# Patient Record
Sex: Male | Born: 1980
Health system: Southern US, Community
[De-identification: ages and names within clinical notes are randomized; demographics above are authoritative.]

## PROBLEM LIST (undated history)

## (undated) DIAGNOSIS — B20 Human immunodeficiency virus [HIV] disease: Secondary | ICD-10-CM

## (undated) DIAGNOSIS — G44009 Cluster headache syndrome, unspecified, not intractable: Secondary | ICD-10-CM

## (undated) DIAGNOSIS — Z21 Asymptomatic human immunodeficiency virus [HIV] infection status: Secondary | ICD-10-CM

## (undated) HISTORY — DX: Cluster headache syndrome, unspecified, not intractable: G44.009

## (undated) HISTORY — DX: Human immunodeficiency virus (HIV) disease: B20

## (undated) HISTORY — DX: Asymptomatic human immunodeficiency virus (hiv) infection status: Z21

---

## 2005-01-29 ENCOUNTER — Encounter (INDEPENDENT_AMBULATORY_CARE_PROVIDER_SITE_OTHER): Payer: Self-pay | Admitting: *Deleted

## 2005-01-29 LAB — CONVERTED CEMR LAB
CD4 Count: 390 microliters
CD4 T Cell Abs: 390

## 2005-07-01 ENCOUNTER — Encounter (INDEPENDENT_AMBULATORY_CARE_PROVIDER_SITE_OTHER): Payer: Self-pay | Admitting: *Deleted

## 2005-07-01 LAB — CONVERTED CEMR LAB
CD4 Count: 595 microliters
HIV 1 RNA Quant: 65 copies/mL

## 2005-12-24 ENCOUNTER — Encounter (INDEPENDENT_AMBULATORY_CARE_PROVIDER_SITE_OTHER): Payer: Self-pay | Admitting: *Deleted

## 2005-12-24 LAB — CONVERTED CEMR LAB
CD4 Count: 638 microliters
HIV 1 RNA Quant: 49 copies/mL

## 2006-03-10 ENCOUNTER — Ambulatory Visit: Payer: Self-pay | Admitting: Internal Medicine

## 2006-03-10 ENCOUNTER — Encounter: Admission: RE | Admit: 2006-03-10 | Discharge: 2006-03-10 | Payer: Self-pay | Admitting: Internal Medicine

## 2006-03-10 ENCOUNTER — Encounter (INDEPENDENT_AMBULATORY_CARE_PROVIDER_SITE_OTHER): Payer: Self-pay | Admitting: *Deleted

## 2006-03-10 LAB — CONVERTED CEMR LAB
CD4 Count: 640 microliters
HIV 1 RNA Quant: 49 copies/mL

## 2006-03-25 ENCOUNTER — Ambulatory Visit: Payer: Self-pay | Admitting: Internal Medicine

## 2006-04-29 ENCOUNTER — Ambulatory Visit: Payer: Self-pay | Admitting: Gastroenterology

## 2006-04-29 DIAGNOSIS — B20 Human immunodeficiency virus [HIV] disease: Secondary | ICD-10-CM

## 2006-04-29 DIAGNOSIS — K029 Dental caries, unspecified: Secondary | ICD-10-CM | POA: Insufficient documentation

## 2006-04-29 DIAGNOSIS — A630 Anogenital (venereal) warts: Secondary | ICD-10-CM

## 2006-05-19 ENCOUNTER — Encounter: Payer: Self-pay | Admitting: Internal Medicine

## 2006-06-08 DIAGNOSIS — R519 Headache, unspecified: Secondary | ICD-10-CM | POA: Insufficient documentation

## 2006-06-08 DIAGNOSIS — R51 Headache: Secondary | ICD-10-CM

## 2006-06-20 ENCOUNTER — Encounter (INDEPENDENT_AMBULATORY_CARE_PROVIDER_SITE_OTHER): Payer: Self-pay | Admitting: *Deleted

## 2006-06-20 LAB — CONVERTED CEMR LAB

## 2006-06-27 ENCOUNTER — Ambulatory Visit (HOSPITAL_COMMUNITY): Admission: RE | Admit: 2006-06-27 | Discharge: 2006-06-27 | Payer: Self-pay | Admitting: Surgery

## 2006-06-27 ENCOUNTER — Encounter (INDEPENDENT_AMBULATORY_CARE_PROVIDER_SITE_OTHER): Payer: Self-pay | Admitting: Specialist

## 2006-07-03 ENCOUNTER — Encounter (INDEPENDENT_AMBULATORY_CARE_PROVIDER_SITE_OTHER): Payer: Self-pay | Admitting: *Deleted

## 2006-07-04 ENCOUNTER — Ambulatory Visit: Payer: Self-pay | Admitting: Internal Medicine

## 2006-07-04 ENCOUNTER — Encounter: Admission: RE | Admit: 2006-07-04 | Discharge: 2006-07-04 | Payer: Self-pay | Admitting: Internal Medicine

## 2006-07-04 LAB — CONVERTED CEMR LAB
ALT: 26 units/L (ref 0–53)
AST: 17 units/L (ref 0–37)
Albumin: 4.6 g/dL (ref 3.5–5.2)
Alkaline Phosphatase: 83 units/L (ref 39–117)
BUN: 13 mg/dL (ref 6–23)
Basophils Absolute: 0 10*3/uL (ref 0.0–0.1)
Basophils Relative: 0 % (ref 0–1)
CD4 % Helper T Cell: 33 %
CD4 Count: 660 microliters
CO2: 24 meq/L (ref 19–32)
Calcium: 9.6 mg/dL (ref 8.4–10.5)
Chloride: 108 meq/L (ref 96–112)
Creatinine, Ser: 1.03 mg/dL (ref 0.40–1.50)
Eosinophils Absolute: 0.2 10*3/uL (ref 0.0–0.7)
Eosinophils Relative: 2 % (ref 0–5)
Glucose, Bld: 80 mg/dL (ref 70–99)
HCT: 42.2 % (ref 39.0–52.0)
HIV 1 RNA Quant: 114 copies/mL — ABNORMAL HIGH (ref <50)
HIV-1 RNA Quant, Log: 2.06 — ABNORMAL HIGH (ref <1.70)
Hemoglobin: 13.8 g/dL (ref 13.0–17.0)
Lymphocytes Relative: 24 % (ref 12–46)
Lymphs Abs: 2.1 10*3/uL (ref 0.7–3.3)
MCHC: 32.7 g/dL (ref 30.0–36.0)
MCV: 86.3 fL (ref 78.0–100.0)
Monocytes Absolute: 1.2 10*3/uL — ABNORMAL HIGH (ref 0.2–0.7)
Monocytes Relative: 13 % — ABNORMAL HIGH (ref 3–11)
Neutro Abs: 5.3 10*3/uL (ref 1.7–7.7)
Neutrophils Relative %: 60 % (ref 43–77)
Platelets: 274 10*3/uL (ref 150–400)
Potassium: 4.7 meq/L (ref 3.5–5.3)
RBC: 4.89 M/uL (ref 4.22–5.81)
RDW: 14.2 % — ABNORMAL HIGH (ref 11.5–14.0)
Sodium: 143 meq/L (ref 135–145)
Total Bilirubin: 0.3 mg/dL (ref 0.3–1.2)
Total Protein: 7.6 g/dL (ref 6.0–8.3)
WBC: 8.9 10*3/uL (ref 4.0–10.5)

## 2006-07-13 ENCOUNTER — Ambulatory Visit: Payer: Self-pay | Admitting: Internal Medicine

## 2006-09-06 ENCOUNTER — Telehealth (INDEPENDENT_AMBULATORY_CARE_PROVIDER_SITE_OTHER): Payer: Self-pay | Admitting: *Deleted

## 2006-09-08 ENCOUNTER — Ambulatory Visit: Payer: Self-pay | Admitting: Internal Medicine

## 2006-09-08 ENCOUNTER — Encounter: Admission: RE | Admit: 2006-09-08 | Discharge: 2006-09-08 | Payer: Self-pay | Admitting: Internal Medicine

## 2006-09-08 LAB — CONVERTED CEMR LAB
ALT: 55 units/L — ABNORMAL HIGH (ref 0–53)
AST: 33 units/L (ref 0–37)
Albumin: 4.5 g/dL (ref 3.5–5.2)
Alkaline Phosphatase: 75 units/L (ref 39–117)
BUN: 15 mg/dL (ref 6–23)
Basophils Absolute: 0 10*3/uL (ref 0.0–0.1)
Basophils Relative: 0 % (ref 0–1)
CD4 Count: 690 microliters
CO2: 25 meq/L (ref 19–32)
Calcium: 9.6 mg/dL (ref 8.4–10.5)
Chloride: 104 meq/L (ref 96–112)
Creatinine, Ser: 1.06 mg/dL (ref 0.40–1.50)
Eosinophils Absolute: 0.2 10*3/uL (ref 0.0–0.7)
Eosinophils Relative: 3 % (ref 0–5)
Glucose, Bld: 84 mg/dL (ref 70–99)
HCT: 43.5 % (ref 39.0–52.0)
HIV 1 RNA Quant: 147 copies/mL — ABNORMAL HIGH (ref <50)
HIV-1 RNA Quant, Log: 2.17 — ABNORMAL HIGH (ref <1.70)
Hemoglobin: 14 g/dL (ref 13.0–17.0)
Lymphocytes Relative: 28 % (ref 12–46)
Lymphs Abs: 2.3 10*3/uL (ref 0.7–3.3)
MCHC: 32.2 g/dL (ref 30.0–36.0)
MCV: 89.7 fL (ref 78.0–100.0)
Monocytes Absolute: 1 10*3/uL — ABNORMAL HIGH (ref 0.2–0.7)
Monocytes Relative: 13 % — ABNORMAL HIGH (ref 3–11)
Neutro Abs: 4.5 10*3/uL (ref 1.7–7.7)
Neutrophils Relative %: 56 % (ref 43–77)
Platelets: 238 10*3/uL (ref 150–400)
Potassium: 4.8 meq/L (ref 3.5–5.3)
RBC: 4.85 M/uL (ref 4.22–5.81)
RDW: 14.7 % — ABNORMAL HIGH (ref 11.5–14.0)
Sodium: 140 meq/L (ref 135–145)
Total Bilirubin: 0.5 mg/dL (ref 0.3–1.2)
Total Protein: 7.7 g/dL (ref 6.0–8.3)
WBC: 8 10*3/uL (ref 4.0–10.5)

## 2006-09-23 ENCOUNTER — Ambulatory Visit: Payer: Self-pay | Admitting: Internal Medicine

## 2006-09-23 DIAGNOSIS — M79609 Pain in unspecified limb: Secondary | ICD-10-CM

## 2006-09-23 DIAGNOSIS — J309 Allergic rhinitis, unspecified: Secondary | ICD-10-CM

## 2006-09-23 DIAGNOSIS — M26609 Unspecified temporomandibular joint disorder, unspecified side: Secondary | ICD-10-CM | POA: Insufficient documentation

## 2006-10-07 ENCOUNTER — Ambulatory Visit: Payer: Self-pay | Admitting: Internal Medicine

## 2006-10-09 ENCOUNTER — Ambulatory Visit (HOSPITAL_COMMUNITY): Admission: RE | Admit: 2006-10-09 | Discharge: 2006-10-09 | Payer: Self-pay | Admitting: Internal Medicine

## 2006-10-10 ENCOUNTER — Telehealth: Payer: Self-pay | Admitting: Internal Medicine

## 2006-10-11 ENCOUNTER — Encounter: Payer: Self-pay | Admitting: Internal Medicine

## 2006-10-20 ENCOUNTER — Telehealth: Payer: Self-pay | Admitting: Internal Medicine

## 2006-10-25 ENCOUNTER — Telehealth: Payer: Self-pay | Admitting: Internal Medicine

## 2006-10-25 ENCOUNTER — Encounter: Payer: Self-pay | Admitting: Internal Medicine

## 2006-11-21 ENCOUNTER — Telehealth: Payer: Self-pay | Admitting: Internal Medicine

## 2006-11-21 ENCOUNTER — Encounter: Payer: Self-pay | Admitting: Internal Medicine

## 2006-11-25 ENCOUNTER — Ambulatory Visit: Payer: Self-pay | Admitting: Internal Medicine

## 2006-11-25 ENCOUNTER — Ambulatory Visit (HOSPITAL_COMMUNITY): Admission: RE | Admit: 2006-11-25 | Discharge: 2006-11-25 | Payer: Self-pay | Admitting: Internal Medicine

## 2006-11-25 ENCOUNTER — Encounter: Admission: RE | Admit: 2006-11-25 | Discharge: 2006-11-25 | Payer: Self-pay | Admitting: Internal Medicine

## 2006-11-25 DIAGNOSIS — G43809 Other migraine, not intractable, without status migrainosus: Secondary | ICD-10-CM

## 2006-11-25 LAB — CONVERTED CEMR LAB
ALT: 38 units/L (ref 0–53)
AST: 20 units/L (ref 0–37)
Albumin: 4.7 g/dL (ref 3.5–5.2)
Alkaline Phosphatase: 69 units/L (ref 39–117)
BUN: 14 mg/dL (ref 6–23)
Basophils Absolute: 0 10*3/uL (ref 0.0–0.1)
Basophils Relative: 0 % (ref 0–1)
CO2: 26 meq/L (ref 19–32)
Calcium: 9.8 mg/dL (ref 8.4–10.5)
Chloride: 105 meq/L (ref 96–112)
Creatinine, Ser: 1.03 mg/dL (ref 0.40–1.50)
Eosinophils Absolute: 0.2 10*3/uL (ref 0.0–0.7)
Eosinophils Relative: 2 % (ref 0–5)
Glucose, Bld: 91 mg/dL (ref 70–99)
HCT: 42.7 % (ref 39.0–52.0)
HIV 1 RNA Quant: 68 copies/mL — ABNORMAL HIGH (ref <50)
HIV-1 RNA Quant, Log: 1.83 — ABNORMAL HIGH (ref <1.70)
Hemoglobin: 14.3 g/dL (ref 13.0–17.0)
Lymphocytes Relative: 31 % (ref 12–46)
Lymphs Abs: 2.3 10*3/uL (ref 0.7–3.3)
MCHC: 33.5 g/dL (ref 30.0–36.0)
MCV: 87.3 fL (ref 78.0–100.0)
Monocytes Absolute: 1 10*3/uL — ABNORMAL HIGH (ref 0.2–0.7)
Monocytes Relative: 13 % — ABNORMAL HIGH (ref 3–11)
Neutro Abs: 3.9 10*3/uL (ref 1.7–7.7)
Neutrophils Relative %: 53 % (ref 43–77)
Platelets: 244 10*3/uL (ref 150–400)
Potassium: 4.6 meq/L (ref 3.5–5.3)
RBC: 4.89 M/uL (ref 4.22–5.81)
RDW: 14 % (ref 11.5–14.0)
Sodium: 140 meq/L (ref 135–145)
Total Bilirubin: 0.4 mg/dL (ref 0.3–1.2)
Total Protein: 7.6 g/dL (ref 6.0–8.3)
WBC: 7.4 10*3/uL (ref 4.0–10.5)

## 2006-11-28 ENCOUNTER — Telehealth: Payer: Self-pay | Admitting: Internal Medicine

## 2006-11-30 ENCOUNTER — Telehealth: Payer: Self-pay | Admitting: Internal Medicine

## 2006-12-09 ENCOUNTER — Encounter (INDEPENDENT_AMBULATORY_CARE_PROVIDER_SITE_OTHER): Payer: Self-pay | Admitting: *Deleted

## 2006-12-14 ENCOUNTER — Ambulatory Visit: Payer: Self-pay | Admitting: Internal Medicine

## 2007-01-19 ENCOUNTER — Telehealth: Payer: Self-pay | Admitting: Internal Medicine

## 2007-02-15 ENCOUNTER — Ambulatory Visit: Payer: Self-pay | Admitting: Internal Medicine

## 2007-02-15 DIAGNOSIS — J019 Acute sinusitis, unspecified: Secondary | ICD-10-CM

## 2007-02-15 DIAGNOSIS — F17210 Nicotine dependence, cigarettes, uncomplicated: Secondary | ICD-10-CM | POA: Insufficient documentation

## 2007-02-16 ENCOUNTER — Encounter (INDEPENDENT_AMBULATORY_CARE_PROVIDER_SITE_OTHER): Payer: Self-pay | Admitting: *Deleted

## 2007-02-21 ENCOUNTER — Encounter: Payer: Self-pay | Admitting: Internal Medicine

## 2007-02-22 ENCOUNTER — Encounter: Payer: Self-pay | Admitting: Internal Medicine

## 2007-03-06 ENCOUNTER — Ambulatory Visit (HOSPITAL_COMMUNITY): Admission: RE | Admit: 2007-03-06 | Discharge: 2007-03-06 | Payer: Self-pay | Admitting: Surgery

## 2007-03-06 ENCOUNTER — Encounter (INDEPENDENT_AMBULATORY_CARE_PROVIDER_SITE_OTHER): Payer: Self-pay | Admitting: Surgery

## 2007-03-14 ENCOUNTER — Encounter: Admission: RE | Admit: 2007-03-14 | Discharge: 2007-03-14 | Payer: Self-pay | Admitting: Internal Medicine

## 2007-03-14 ENCOUNTER — Ambulatory Visit: Payer: Self-pay | Admitting: Internal Medicine

## 2007-03-14 LAB — CONVERTED CEMR LAB
ALT: 14 units/L (ref 0–53)
AST: 15 units/L (ref 0–37)
Albumin: 4.4 g/dL (ref 3.5–5.2)
Alkaline Phosphatase: 84 units/L (ref 39–117)
BUN: 11 mg/dL (ref 6–23)
Basophils Absolute: 0 10*3/uL (ref 0.0–0.1)
Basophils Relative: 0 % (ref 0–1)
CO2: 27 meq/L (ref 19–32)
Calcium: 9.5 mg/dL (ref 8.4–10.5)
Chloride: 106 meq/L (ref 96–112)
Creatinine, Ser: 1.04 mg/dL (ref 0.40–1.50)
Eosinophils Absolute: 0.2 10*3/uL (ref 0.2–0.7)
Eosinophils Relative: 2 % (ref 0–5)
Glucose, Bld: 100 mg/dL — ABNORMAL HIGH (ref 70–99)
HCT: 42.6 % (ref 39.0–52.0)
HIV 1 RNA Quant: <50 copies/mL (ref <50)
HIV-1 RNA Quant, Log: <1.7 (ref <1.70)
Hemoglobin: 13.9 g/dL (ref 13.0–17.0)
Lymphocytes Relative: 28 % (ref 12–46)
Lymphs Abs: 2.5 10*3/uL (ref 0.7–4.0)
MCHC: 32.6 g/dL (ref 30.0–36.0)
MCV: 88 fL (ref 78.0–100.0)
Monocytes Absolute: 0.7 10*3/uL (ref 0.1–1.0)
Monocytes Relative: 8 % (ref 3–12)
Neutro Abs: 5.4 10*3/uL (ref 1.7–7.7)
Neutrophils Relative %: 61 % (ref 43–77)
Platelets: 253 10*3/uL (ref 150–400)
Potassium: 4 meq/L (ref 3.5–5.3)
RBC: 4.84 M/uL (ref 4.22–5.81)
RDW: 13.1 % (ref 11.5–15.5)
Sodium: 142 meq/L (ref 135–145)
Total Bilirubin: 0.3 mg/dL (ref 0.3–1.2)
Total Protein: 7.4 g/dL (ref 6.0–8.3)
WBC: 8.8 10*3/uL (ref 4.0–10.5)

## 2007-03-27 ENCOUNTER — Telehealth: Payer: Self-pay | Admitting: Internal Medicine

## 2007-03-28 ENCOUNTER — Telehealth: Payer: Self-pay | Admitting: Internal Medicine

## 2007-03-29 ENCOUNTER — Encounter (INDEPENDENT_AMBULATORY_CARE_PROVIDER_SITE_OTHER): Payer: Self-pay | Admitting: *Deleted

## 2007-03-29 ENCOUNTER — Ambulatory Visit: Payer: Self-pay | Admitting: Internal Medicine

## 2007-04-24 ENCOUNTER — Encounter (INDEPENDENT_AMBULATORY_CARE_PROVIDER_SITE_OTHER): Payer: Self-pay | Admitting: *Deleted

## 2007-06-06 ENCOUNTER — Telehealth: Payer: Self-pay

## 2007-06-09 ENCOUNTER — Ambulatory Visit: Payer: Self-pay | Admitting: Internal Medicine

## 2007-06-09 DIAGNOSIS — R112 Nausea with vomiting, unspecified: Secondary | ICD-10-CM

## 2007-06-19 ENCOUNTER — Telehealth: Payer: Self-pay | Admitting: Internal Medicine

## 2007-07-03 ENCOUNTER — Telehealth: Payer: Self-pay | Admitting: Internal Medicine

## 2007-07-03 ENCOUNTER — Encounter: Admission: RE | Admit: 2007-07-03 | Discharge: 2007-07-03 | Payer: Self-pay | Admitting: Internal Medicine

## 2007-07-03 ENCOUNTER — Ambulatory Visit: Payer: Self-pay | Admitting: Internal Medicine

## 2007-07-03 LAB — CONVERTED CEMR LAB
ALT: 25 units/L (ref 0–53)
AST: 20 units/L (ref 0–37)
Albumin: 4.5 g/dL (ref 3.5–5.2)
Alkaline Phosphatase: 70 units/L (ref 39–117)
BUN: 8 mg/dL (ref 6–23)
Basophils Absolute: 0 10*3/uL (ref 0.0–0.1)
Basophils Relative: 1 % (ref 0–1)
CO2: 32 meq/L (ref 19–32)
Calcium: 9.4 mg/dL (ref 8.4–10.5)
Chloride: 102 meq/L (ref 96–112)
Creatinine, Ser: 0.98 mg/dL (ref 0.40–1.50)
Eosinophils Absolute: 0.1 10*3/uL (ref 0.0–0.7)
Eosinophils Relative: 2 % (ref 0–5)
Glucose, Bld: 94 mg/dL (ref 70–99)
HCT: 38.3 % — ABNORMAL LOW (ref 39.0–52.0)
HIV 1 RNA Quant: 57 copies/mL — ABNORMAL HIGH (ref <50)
HIV-1 RNA Quant, Log: 1.76 — ABNORMAL HIGH (ref <1.70)
Hemoglobin: 13 g/dL (ref 13.0–17.0)
Lymphocytes Relative: 28 % (ref 12–46)
Lymphs Abs: 1.9 10*3/uL (ref 0.7–4.0)
MCHC: 33.9 g/dL (ref 30.0–36.0)
MCV: 85.1 fL (ref 78.0–100.0)
Monocytes Absolute: 0.7 10*3/uL (ref 0.1–1.0)
Monocytes Relative: 10 % (ref 3–12)
Neutro Abs: 4 10*3/uL (ref 1.7–7.7)
Neutrophils Relative %: 60 % (ref 43–77)
Platelets: 268 10*3/uL (ref 150–400)
Potassium: 3.2 meq/L — ABNORMAL LOW (ref 3.5–5.3)
RBC: 4.5 M/uL (ref 4.22–5.81)
RDW: 14 % (ref 11.5–15.5)
Sodium: 139 meq/L (ref 135–145)
Total Bilirubin: 0.4 mg/dL (ref 0.3–1.2)
Total Protein: 7 g/dL (ref 6.0–8.3)
WBC: 6.7 10*3/uL (ref 4.0–10.5)

## 2007-07-12 ENCOUNTER — Telehealth (INDEPENDENT_AMBULATORY_CARE_PROVIDER_SITE_OTHER): Payer: Self-pay | Admitting: Licensed Clinical Social Worker

## 2007-07-12 ENCOUNTER — Ambulatory Visit: Payer: Self-pay | Admitting: Internal Medicine

## 2007-07-20 ENCOUNTER — Encounter (INDEPENDENT_AMBULATORY_CARE_PROVIDER_SITE_OTHER): Payer: Self-pay | Admitting: *Deleted

## 2007-07-21 ENCOUNTER — Ambulatory Visit (HOSPITAL_COMMUNITY): Admission: RE | Admit: 2007-07-21 | Discharge: 2007-07-21 | Payer: Self-pay | Admitting: Internal Medicine

## 2007-07-24 ENCOUNTER — Telehealth: Payer: Self-pay | Admitting: Internal Medicine

## 2007-07-26 ENCOUNTER — Telehealth: Payer: Self-pay | Admitting: Internal Medicine

## 2007-08-23 ENCOUNTER — Telehealth: Payer: Self-pay | Admitting: Internal Medicine

## 2007-08-29 ENCOUNTER — Telehealth: Payer: Self-pay | Admitting: Internal Medicine

## 2007-09-21 ENCOUNTER — Telehealth: Payer: Self-pay | Admitting: Internal Medicine

## 2007-10-17 ENCOUNTER — Encounter: Admission: RE | Admit: 2007-10-17 | Discharge: 2007-10-17 | Payer: Self-pay | Admitting: Internal Medicine

## 2007-10-17 ENCOUNTER — Ambulatory Visit: Payer: Self-pay | Admitting: Internal Medicine

## 2007-10-17 LAB — CONVERTED CEMR LAB
ALT: 29 units/L (ref 0–53)
AST: 19 units/L (ref 0–37)
Albumin: 4.7 g/dL (ref 3.5–5.2)
Alkaline Phosphatase: 79 units/L (ref 39–117)
BUN: 11 mg/dL (ref 6–23)
Basophils Absolute: 0 10*3/uL (ref 0.0–0.1)
Basophils Relative: 1 % (ref 0–1)
CO2: 26 meq/L (ref 19–32)
Calcium: 9.8 mg/dL (ref 8.4–10.5)
Chloride: 107 meq/L (ref 96–112)
Creatinine, Ser: 0.95 mg/dL (ref 0.40–1.50)
Eosinophils Absolute: 0.2 10*3/uL (ref 0.0–0.7)
Eosinophils Relative: 2 % (ref 0–5)
Glucose, Bld: 90 mg/dL (ref 70–99)
HCT: 41.5 % (ref 39.0–52.0)
HIV 1 RNA Quant: <50 copies/mL (ref <50)
HIV-1 RNA Quant, Log: <1.7 (ref <1.70)
Hemoglobin: 13.8 g/dL (ref 13.0–17.0)
Lymphocytes Relative: 29 % (ref 12–46)
Lymphs Abs: 2 10*3/uL (ref 0.7–4.0)
MCHC: 33.3 g/dL (ref 30.0–36.0)
MCV: 87.2 fL (ref 78.0–100.0)
Monocytes Absolute: 0.8 10*3/uL (ref 0.1–1.0)
Monocytes Relative: 12 % (ref 3–12)
Neutro Abs: 3.8 10*3/uL (ref 1.7–7.7)
Neutrophils Relative %: 56 % (ref 43–77)
Platelets: 259 10*3/uL (ref 150–400)
Potassium: 4.6 meq/L (ref 3.5–5.3)
RBC: 4.76 M/uL (ref 4.22–5.81)
RDW: 14.3 % (ref 11.5–15.5)
Sodium: 141 meq/L (ref 135–145)
Total Bilirubin: 0.5 mg/dL (ref 0.3–1.2)
Total Protein: 7.4 g/dL (ref 6.0–8.3)
WBC: 6.8 10*3/uL (ref 4.0–10.5)

## 2007-10-24 ENCOUNTER — Ambulatory Visit: Payer: Self-pay | Admitting: Internal Medicine

## 2007-11-13 ENCOUNTER — Telehealth: Payer: Self-pay | Admitting: Internal Medicine

## 2007-11-16 ENCOUNTER — Encounter: Payer: Self-pay | Admitting: Internal Medicine

## 2007-11-22 ENCOUNTER — Telehealth: Payer: Self-pay | Admitting: Internal Medicine

## 2007-12-18 ENCOUNTER — Telehealth (INDEPENDENT_AMBULATORY_CARE_PROVIDER_SITE_OTHER): Payer: Self-pay | Admitting: *Deleted

## 2007-12-26 ENCOUNTER — Telehealth: Payer: Self-pay | Admitting: Internal Medicine

## 2007-12-27 ENCOUNTER — Ambulatory Visit: Payer: Self-pay | Admitting: Internal Medicine

## 2007-12-27 DIAGNOSIS — R21 Rash and other nonspecific skin eruption: Secondary | ICD-10-CM

## 2007-12-27 LAB — CONVERTED CEMR LAB
ALT: 23 units/L (ref 0–53)
AST: 17 units/L (ref 0–37)
Albumin: 5 g/dL (ref 3.5–5.2)
Alkaline Phosphatase: 81 units/L (ref 39–117)
BUN: 10 mg/dL (ref 6–23)
Basophils Absolute: 0 10*3/uL (ref 0.0–0.1)
Basophils Relative: 1 % (ref 0–1)
CO2: 23 meq/L (ref 19–32)
Calcium: 9.7 mg/dL (ref 8.4–10.5)
Chloride: 105 meq/L (ref 96–112)
Cholesterol: 196 mg/dL (ref 0–200)
Creatinine, Ser: 0.9 mg/dL (ref 0.40–1.50)
Eosinophils Absolute: 0.2 10*3/uL (ref 0.0–0.7)
Eosinophils Relative: 2 % (ref 0–5)
Glucose, Bld: 89 mg/dL (ref 70–99)
HCT: 41.8 % (ref 39.0–52.0)
HDL: 41 mg/dL (ref >39)
HIV 1 RNA Quant: 186 copies/mL — ABNORMAL HIGH (ref <50)
HIV-1 RNA Quant, Log: 2.27 — ABNORMAL HIGH (ref <1.70)
Hemoglobin: 14.3 g/dL (ref 13.0–17.0)
LDL Cholesterol: 133 mg/dL — ABNORMAL HIGH (ref 0–99)
Lymphocytes Relative: 30 % (ref 12–46)
Lymphs Abs: 2.5 10*3/uL (ref 0.7–4.0)
MCHC: 34.2 g/dL (ref 30.0–36.0)
MCV: 85.3 fL (ref 78.0–100.0)
Monocytes Absolute: 0.9 10*3/uL (ref 0.1–1.0)
Monocytes Relative: 11 % (ref 3–12)
Neutro Abs: 4.8 10*3/uL (ref 1.7–7.7)
Neutrophils Relative %: 57 % (ref 43–77)
Platelets: 284 10*3/uL (ref 150–400)
Potassium: 4.3 meq/L (ref 3.5–5.3)
RBC: 4.9 M/uL (ref 4.22–5.81)
RDW: 13.4 % (ref 11.5–15.5)
Sodium: 140 meq/L (ref 135–145)
Total Bilirubin: 0.6 mg/dL (ref 0.3–1.2)
Total CHOL/HDL Ratio: 4.8
Total Protein: 8.1 g/dL (ref 6.0–8.3)
Triglycerides: 109 mg/dL (ref <150)
VLDL: 22 mg/dL (ref 0–40)
WBC: 8.4 10*3/uL (ref 4.0–10.5)

## 2008-01-02 ENCOUNTER — Telehealth: Payer: Self-pay

## 2008-01-10 ENCOUNTER — Ambulatory Visit: Payer: Self-pay | Admitting: Internal Medicine

## 2008-02-27 ENCOUNTER — Ambulatory Visit: Payer: Self-pay | Admitting: Internal Medicine

## 2008-05-31 ENCOUNTER — Ambulatory Visit: Payer: Self-pay | Admitting: Internal Medicine

## 2008-05-31 LAB — CONVERTED CEMR LAB
ALT: 27 units/L (ref 0–53)
AST: 19 units/L (ref 0–37)
Albumin: 4.4 g/dL (ref 3.5–5.2)
Alkaline Phosphatase: 73 units/L (ref 39–117)
BUN: 9 mg/dL (ref 6–23)
Basophils Absolute: 0 10*3/uL (ref 0.0–0.1)
Basophils Relative: 0 % (ref 0–1)
CO2: 26 meq/L (ref 19–32)
Calcium: 9.5 mg/dL (ref 8.4–10.5)
Chloride: 106 meq/L (ref 96–112)
Creatinine, Ser: 1.08 mg/dL (ref 0.40–1.50)
Eosinophils Absolute: 0.2 10*3/uL (ref 0.0–0.7)
Eosinophils Relative: 3 % (ref 0–5)
Glucose, Bld: 91 mg/dL (ref 70–99)
HCT: 45.2 % (ref 39.0–52.0)
HIV 1 RNA Quant: 63 copies/mL — ABNORMAL HIGH (ref <48)
HIV-1 RNA Quant, Log: 1.8 — ABNORMAL HIGH (ref <1.68)
Hemoglobin: 14.8 g/dL (ref 13.0–17.0)
Lymphocytes Relative: 33 % (ref 12–46)
Lymphs Abs: 2.7 10*3/uL (ref 0.7–4.0)
MCHC: 32.7 g/dL (ref 30.0–36.0)
MCV: 89.3 fL (ref 78.0–100.0)
Monocytes Absolute: 1 10*3/uL (ref 0.1–1.0)
Monocytes Relative: 13 % — ABNORMAL HIGH (ref 3–12)
Neutro Abs: 4.3 10*3/uL (ref 1.7–7.7)
Neutrophils Relative %: 51 % (ref 43–77)
Platelets: 244 10*3/uL (ref 150–400)
Potassium: 4.4 meq/L (ref 3.5–5.3)
RBC: 5.06 M/uL (ref 4.22–5.81)
RDW: 13.9 % (ref 11.5–15.5)
Sodium: 141 meq/L (ref 135–145)
Total Bilirubin: 0.3 mg/dL (ref 0.3–1.2)
Total Protein: 7.3 g/dL (ref 6.0–8.3)
WBC: 8.3 10*3/uL (ref 4.0–10.5)

## 2008-06-14 ENCOUNTER — Ambulatory Visit: Payer: Self-pay | Admitting: Internal Medicine

## 2008-06-14 ENCOUNTER — Encounter (INDEPENDENT_AMBULATORY_CARE_PROVIDER_SITE_OTHER): Payer: Self-pay | Admitting: *Deleted

## 2008-06-19 ENCOUNTER — Ambulatory Visit: Payer: Self-pay | Admitting: Internal Medicine

## 2008-07-11 ENCOUNTER — Encounter (INDEPENDENT_AMBULATORY_CARE_PROVIDER_SITE_OTHER): Payer: Self-pay | Admitting: *Deleted

## 2008-07-16 ENCOUNTER — Telehealth (INDEPENDENT_AMBULATORY_CARE_PROVIDER_SITE_OTHER): Payer: Self-pay | Admitting: *Deleted

## 2008-07-30 ENCOUNTER — Telehealth (INDEPENDENT_AMBULATORY_CARE_PROVIDER_SITE_OTHER): Payer: Self-pay | Admitting: *Deleted

## 2008-09-27 ENCOUNTER — Ambulatory Visit: Payer: Self-pay | Admitting: Internal Medicine

## 2008-09-27 LAB — CONVERTED CEMR LAB
ALT: 29 units/L (ref 0–53)
AST: 21 units/L (ref 0–37)
Albumin: 4.4 g/dL (ref 3.5–5.2)
Alkaline Phosphatase: 65 units/L (ref 39–117)
BUN: 8 mg/dL (ref 6–23)
Basophils Absolute: 0 10*3/uL (ref 0.0–0.1)
Basophils Relative: 0 % (ref 0–1)
CO2: 24 meq/L (ref 19–32)
Calcium: 8.9 mg/dL (ref 8.4–10.5)
Chloride: 107 meq/L (ref 96–112)
Creatinine, Ser: 1.02 mg/dL (ref 0.40–1.50)
Eosinophils Absolute: 0.3 10*3/uL (ref 0.0–0.7)
Eosinophils Relative: 3 % (ref 0–5)
GFR calc Af Amer: >60 mL/min (ref >60)
GFR calc non Af Amer: >60 mL/min (ref >60)
Glucose, Bld: 96 mg/dL (ref 70–99)
HCT: 43.8 % (ref 39.0–52.0)
HIV 1 RNA Quant: 102 copies/mL — ABNORMAL HIGH (ref <48)
HIV-1 RNA Quant, Log: 2.01 — ABNORMAL HIGH (ref <1.68)
Hemoglobin: 13.9 g/dL (ref 13.0–17.0)
Lymphocytes Relative: 26 % (ref 12–46)
Lymphs Abs: 2.5 10*3/uL (ref 0.7–4.0)
MCHC: 31.7 g/dL (ref 30.0–36.0)
MCV: 89.6 fL (ref 78.0–100.0)
Monocytes Absolute: 1 10*3/uL (ref 0.1–1.0)
Monocytes Relative: 10 % (ref 3–12)
Neutro Abs: 6 10*3/uL (ref 1.7–7.7)
Neutrophils Relative %: 61 % (ref 43–77)
Platelets: 225 10*3/uL (ref 150–400)
Potassium: 4.3 meq/L (ref 3.5–5.3)
RBC: 4.89 M/uL (ref 4.22–5.81)
RDW: 14.2 % (ref 11.5–15.5)
Sodium: 142 meq/L (ref 135–145)
Total Bilirubin: 0.4 mg/dL (ref 0.3–1.2)
Total Protein: 7.5 g/dL (ref 6.0–8.3)
WBC: 9.8 10*3/uL (ref 4.0–10.5)

## 2008-10-03 ENCOUNTER — Telehealth: Payer: Self-pay | Admitting: Internal Medicine

## 2008-10-11 ENCOUNTER — Ambulatory Visit: Payer: Self-pay | Admitting: Internal Medicine

## 2008-10-29 ENCOUNTER — Telehealth: Payer: Self-pay | Admitting: Internal Medicine

## 2008-11-06 ENCOUNTER — Telehealth (INDEPENDENT_AMBULATORY_CARE_PROVIDER_SITE_OTHER): Payer: Self-pay | Admitting: *Deleted

## 2008-12-02 ENCOUNTER — Telehealth: Payer: Self-pay | Admitting: Internal Medicine

## 2008-12-19 ENCOUNTER — Telehealth (INDEPENDENT_AMBULATORY_CARE_PROVIDER_SITE_OTHER): Payer: Self-pay | Admitting: *Deleted

## 2008-12-31 ENCOUNTER — Telehealth: Payer: Self-pay | Admitting: Internal Medicine

## 2009-01-10 ENCOUNTER — Telehealth (INDEPENDENT_AMBULATORY_CARE_PROVIDER_SITE_OTHER): Payer: Self-pay | Admitting: *Deleted

## 2009-01-27 ENCOUNTER — Telehealth: Payer: Self-pay | Admitting: Internal Medicine

## 2009-01-28 ENCOUNTER — Ambulatory Visit: Payer: Self-pay | Admitting: Internal Medicine

## 2009-01-28 LAB — CONVERTED CEMR LAB
ALT: 32 units/L (ref 0–53)
AST: 18 units/L (ref 0–37)
Albumin: 4.6 g/dL (ref 3.5–5.2)
Alkaline Phosphatase: 76 units/L (ref 39–117)
BUN: 7 mg/dL (ref 6–23)
Basophils Absolute: 0 10*3/uL (ref 0.0–0.1)
Basophils Relative: 0 % (ref 0–1)
CO2: 23 meq/L (ref 19–32)
Calcium: 9.6 mg/dL (ref 8.4–10.5)
Chloride: 107 meq/L (ref 96–112)
Creatinine, Ser: 1.04 mg/dL (ref 0.40–1.50)
Eosinophils Absolute: 0.2 10*3/uL (ref 0.0–0.7)
Eosinophils Relative: 2 % (ref 0–5)
Glucose, Bld: 89 mg/dL (ref 70–99)
HCT: 43.9 % (ref 39.0–52.0)
HIV 1 RNA Quant: 209 copies/mL — ABNORMAL HIGH (ref <48)
HIV-1 RNA Quant, Log: 2.32 — ABNORMAL HIGH (ref <1.68)
Hemoglobin: 14.5 g/dL (ref 13.0–17.0)
Lymphocytes Relative: 31 % (ref 12–46)
Lymphs Abs: 2.2 10*3/uL (ref 0.7–4.0)
MCHC: 33 g/dL (ref 30.0–36.0)
MCV: 86.1 fL (ref >=78.0)
Monocytes Absolute: 0.8 10*3/uL (ref 0.1–1.0)
Monocytes Relative: 11 % (ref 3–12)
Neutro Abs: 4 10*3/uL (ref 1.7–7.7)
Neutrophils Relative %: 56 % (ref 43–77)
Platelets: 261 10*3/uL (ref 150–400)
Potassium: 4.2 meq/L (ref 3.5–5.3)
RBC: 5.1 M/uL (ref 4.22–5.81)
RDW: 13.6 % (ref 11.5–15.5)
Sodium: 142 meq/L (ref 135–145)
Total Bilirubin: 0.3 mg/dL (ref 0.3–1.2)
Total Protein: 7.5 g/dL (ref 6.0–8.3)
WBC: 7.1 10*3/uL (ref 4.0–10.5)

## 2009-02-11 ENCOUNTER — Ambulatory Visit: Payer: Self-pay | Admitting: Internal Medicine

## 2009-02-26 ENCOUNTER — Telehealth: Payer: Self-pay | Admitting: Infectious Diseases

## 2009-03-14 ENCOUNTER — Telehealth (INDEPENDENT_AMBULATORY_CARE_PROVIDER_SITE_OTHER): Payer: Self-pay | Admitting: *Deleted

## 2009-03-28 ENCOUNTER — Telehealth (INDEPENDENT_AMBULATORY_CARE_PROVIDER_SITE_OTHER): Payer: Self-pay | Admitting: *Deleted

## 2009-04-01 ENCOUNTER — Telehealth: Payer: Self-pay | Admitting: Internal Medicine

## 2009-05-05 ENCOUNTER — Telehealth: Payer: Self-pay | Admitting: Internal Medicine

## 2009-05-08 ENCOUNTER — Other Ambulatory Visit: Payer: Self-pay | Admitting: Internal Medicine

## 2009-05-08 ENCOUNTER — Ambulatory Visit: Payer: Self-pay | Admitting: Internal Medicine

## 2009-05-08 LAB — CONVERTED CEMR LAB
ALT: 29 units/L (ref 0–53)
AST: 25 units/L (ref 0–37)
Albumin: 5.4 g/dL — ABNORMAL HIGH (ref 3.5–5.2)
Alkaline Phosphatase: 84 units/L (ref 39–117)
BUN: 13 mg/dL (ref 6–23)
Basophils Absolute: 0 10*3/uL (ref 0.0–0.1)
Basophils Relative: 0 % (ref 0–1)
CO2: 33 meq/L — ABNORMAL HIGH (ref 19–32)
Calcium: 10.6 mg/dL — ABNORMAL HIGH (ref 8.4–10.5)
Chloride: 90 meq/L — ABNORMAL LOW (ref 96–112)
Creatinine, Ser: 1.25 mg/dL (ref 0.40–1.50)
Eosinophils Absolute: 0.1 10*3/uL (ref 0.0–0.7)
Eosinophils Relative: 1 % (ref 0–5)
Glucose, Bld: 101 mg/dL — ABNORMAL HIGH (ref 70–99)
HCT: 46.9 % (ref 39.0–52.0)
HIV 1 RNA Quant: 75 copies/mL — ABNORMAL HIGH (ref <48)
HIV-1 RNA Quant, Log: 1.88 — ABNORMAL HIGH (ref <1.68)
Hemoglobin: 16.2 g/dL (ref 13.0–17.0)
Lymphocytes Relative: 26 % (ref 12–46)
Lymphs Abs: 2.8 10*3/uL (ref 0.7–4.0)
MCHC: 34.5 g/dL (ref 30.0–36.0)
MCV: 83.6 fL (ref >=78.0)
Monocytes Absolute: 1.3 10*3/uL — ABNORMAL HIGH (ref 0.1–1.0)
Monocytes Relative: 12 % (ref 3–12)
Neutro Abs: 6.8 10*3/uL (ref 1.7–7.7)
Neutrophils Relative %: 62 % (ref 43–77)
Platelets: 342 10*3/uL (ref 150–400)
Potassium: 3.1 meq/L — ABNORMAL LOW (ref 3.5–5.3)
RBC: 5.61 M/uL (ref 4.22–5.81)
RDW: 12.9 % (ref 11.5–15.5)
Sodium: 138 meq/L (ref 135–145)
Total Bilirubin: 0.7 mg/dL (ref 0.3–1.2)
Total Protein: 8.7 g/dL — ABNORMAL HIGH (ref 6.0–8.3)
WBC: 11 10*3/uL — ABNORMAL HIGH (ref 4.0–10.5)

## 2009-05-09 ENCOUNTER — Emergency Department (HOSPITAL_COMMUNITY): Admission: EM | Admit: 2009-05-09 | Discharge: 2009-05-09 | Payer: Self-pay | Admitting: Emergency Medicine

## 2009-05-21 ENCOUNTER — Encounter: Payer: Self-pay | Admitting: Internal Medicine

## 2009-05-28 ENCOUNTER — Encounter (INDEPENDENT_AMBULATORY_CARE_PROVIDER_SITE_OTHER): Payer: Self-pay | Admitting: *Deleted

## 2009-06-04 ENCOUNTER — Ambulatory Visit: Payer: Self-pay | Admitting: Internal Medicine

## 2009-06-12 ENCOUNTER — Encounter (INDEPENDENT_AMBULATORY_CARE_PROVIDER_SITE_OTHER): Payer: Self-pay | Admitting: *Deleted

## 2009-07-01 ENCOUNTER — Encounter (INDEPENDENT_AMBULATORY_CARE_PROVIDER_SITE_OTHER): Payer: Self-pay | Admitting: *Deleted

## 2009-07-07 ENCOUNTER — Telehealth: Payer: Self-pay | Admitting: Internal Medicine

## 2009-07-16 ENCOUNTER — Ambulatory Visit: Payer: Self-pay | Admitting: Internal Medicine

## 2009-07-16 LAB — CONVERTED CEMR LAB
ALT: 22 units/L (ref 0–53)
AST: 22 units/L (ref 0–37)
Albumin: 4.7 g/dL (ref 3.5–5.2)
Alkaline Phosphatase: 70 units/L (ref 39–117)
BUN: 9 mg/dL (ref 6–23)
CO2: 35 meq/L — ABNORMAL HIGH (ref 19–32)
Calcium: 9.6 mg/dL (ref 8.4–10.5)
Chloride: 93 meq/L — ABNORMAL LOW (ref 96–112)
Creatinine, Ser: 0.91 mg/dL (ref 0.40–1.50)
Glucose, Bld: 91 mg/dL (ref 70–99)
Helicobacter Pylori Antibody-IgG: 0.5
Potassium: 2.6 meq/L — CL (ref 3.5–5.3)
Sodium: 140 meq/L (ref 135–145)
Total Bilirubin: 0.6 mg/dL (ref 0.3–1.2)
Total Protein: 7.5 g/dL (ref 6.0–8.3)

## 2009-07-17 ENCOUNTER — Encounter: Payer: Self-pay | Admitting: Internal Medicine

## 2009-07-17 DIAGNOSIS — E876 Hypokalemia: Secondary | ICD-10-CM | POA: Insufficient documentation

## 2009-07-29 ENCOUNTER — Telehealth (INDEPENDENT_AMBULATORY_CARE_PROVIDER_SITE_OTHER): Payer: Self-pay | Admitting: *Deleted

## 2009-08-06 ENCOUNTER — Telehealth: Payer: Self-pay | Admitting: Internal Medicine

## 2009-08-12 ENCOUNTER — Telehealth: Payer: Self-pay | Admitting: Internal Medicine

## 2009-08-26 ENCOUNTER — Emergency Department (HOSPITAL_COMMUNITY): Admission: EM | Admit: 2009-08-26 | Discharge: 2009-08-26 | Payer: Self-pay | Admitting: Emergency Medicine

## 2009-09-03 ENCOUNTER — Ambulatory Visit: Payer: Self-pay | Admitting: Internal Medicine

## 2009-09-03 LAB — CONVERTED CEMR LAB
ALT: 25 units/L (ref 0–53)
AST: 20 units/L (ref 0–37)
Albumin: 5 g/dL (ref 3.5–5.2)
Alkaline Phosphatase: 92 units/L (ref 39–117)
BUN: 9 mg/dL (ref 6–23)
Basophils Absolute: 0 10*3/uL (ref 0.0–0.1)
Basophils Relative: 0 % (ref 0–1)
CO2: 29 meq/L (ref 19–32)
Calcium: 9.9 mg/dL (ref 8.4–10.5)
Chloride: 93 meq/L — ABNORMAL LOW (ref 96–112)
Creatinine, Ser: 1.18 mg/dL (ref 0.40–1.50)
Eosinophils Absolute: 0.2 10*3/uL (ref 0.0–0.7)
Eosinophils Relative: 2 % (ref 0–5)
Glucose, Bld: 87 mg/dL (ref 70–99)
HCT: 44.9 % (ref 39.0–52.0)
HIV 1 RNA Quant: <48 copies/mL (ref <48)
HIV-1 RNA Quant, Log: <1.68 (ref <1.68)
Hemoglobin: 14.9 g/dL (ref 13.0–17.0)
Lymphocytes Relative: 30 % (ref 12–46)
Lymphs Abs: 2.7 10*3/uL (ref 0.7–4.0)
MCHC: 33.2 g/dL (ref 30.0–36.0)
MCV: 86.3 fL (ref 78.0–100.0)
Monocytes Absolute: 0.8 10*3/uL (ref 0.1–1.0)
Monocytes Relative: 9 % (ref 3–12)
Neutro Abs: 5.4 10*3/uL (ref 1.7–7.7)
Neutrophils Relative %: 59 % (ref 43–77)
Platelets: 331 10*3/uL (ref 150–400)
Potassium: 3.3 meq/L — ABNORMAL LOW (ref 3.5–5.3)
RBC: 5.2 M/uL (ref 4.22–5.81)
RDW: 13.6 % (ref 11.5–15.5)
Sodium: 136 meq/L (ref 135–145)
Total Bilirubin: 0.7 mg/dL (ref 0.3–1.2)
Total Protein: 8.1 g/dL (ref 6.0–8.3)
WBC: 9.1 10*3/uL (ref 4.0–10.5)

## 2009-09-16 ENCOUNTER — Ambulatory Visit: Payer: Self-pay | Admitting: Internal Medicine

## 2009-09-17 ENCOUNTER — Emergency Department (HOSPITAL_COMMUNITY): Admission: EM | Admit: 2009-09-17 | Discharge: 2009-09-18 | Payer: Self-pay | Admitting: Emergency Medicine

## 2009-12-31 ENCOUNTER — Ambulatory Visit: Payer: Self-pay | Admitting: Internal Medicine

## 2009-12-31 LAB — CONVERTED CEMR LAB
ALT: 20 units/L (ref 0–53)
AST: 18 units/L (ref 0–37)
Albumin: 4.5 g/dL (ref 3.5–5.2)
Alkaline Phosphatase: 73 units/L (ref 39–117)
BUN: 12 mg/dL (ref 6–23)
Basophils Absolute: 0 10*3/uL (ref 0.0–0.1)
Basophils Relative: 0 % (ref 0–1)
CO2: 27 meq/L (ref 19–32)
Calcium: 9.1 mg/dL (ref 8.4–10.5)
Chloride: 108 meq/L (ref 96–112)
Cholesterol: 150 mg/dL (ref 0–200)
Creatinine, Ser: 1.2 mg/dL (ref 0.40–1.50)
Eosinophils Absolute: 0.3 10*3/uL (ref 0.0–0.7)
Eosinophils Relative: 3 % (ref 0–5)
Glucose, Bld: 84 mg/dL (ref 70–99)
HCT: 44.1 % (ref 39.0–52.0)
HDL: 36 mg/dL — ABNORMAL LOW (ref >39)
HIV 1 RNA Quant: 91 copies/mL — ABNORMAL HIGH (ref <48)
HIV-1 RNA Quant, Log: 1.96 — ABNORMAL HIGH (ref <1.68)
Hemoglobin: 14.8 g/dL (ref 13.0–17.0)
LDL Cholesterol: 92 mg/dL (ref 0–99)
Lymphocytes Relative: 33 % (ref 12–46)
Lymphs Abs: 3 10*3/uL (ref 0.7–4.0)
MCHC: 33.6 g/dL (ref 30.0–36.0)
MCV: 89.1 fL (ref 78.0–100.0)
Monocytes Absolute: 1 10*3/uL (ref 0.1–1.0)
Monocytes Relative: 11 % (ref 3–12)
Neutro Abs: 4.7 10*3/uL (ref 1.7–7.7)
Neutrophils Relative %: 52 % (ref 43–77)
Platelets: 283 10*3/uL (ref 150–400)
Potassium: 4.6 meq/L (ref 3.5–5.3)
RBC: 4.95 M/uL (ref 4.22–5.81)
RDW: 14.4 % (ref 11.5–15.5)
Sodium: 142 meq/L (ref 135–145)
Total Bilirubin: 0.4 mg/dL (ref 0.3–1.2)
Total CHOL/HDL Ratio: 4.2
Total Protein: 7.2 g/dL (ref 6.0–8.3)
Triglycerides: 110 mg/dL (ref <150)
VLDL: 22 mg/dL (ref 0–40)
WBC: 9 10*3/uL (ref 4.0–10.5)

## 2010-01-15 ENCOUNTER — Ambulatory Visit: Payer: Self-pay | Admitting: Internal Medicine

## 2010-01-30 ENCOUNTER — Encounter: Payer: Self-pay | Admitting: Internal Medicine

## 2010-02-12 ENCOUNTER — Encounter (INDEPENDENT_AMBULATORY_CARE_PROVIDER_SITE_OTHER): Payer: Self-pay | Admitting: *Deleted

## 2010-02-13 ENCOUNTER — Telehealth (INDEPENDENT_AMBULATORY_CARE_PROVIDER_SITE_OTHER): Payer: Self-pay | Admitting: Licensed Clinical Social Worker

## 2010-04-08 ENCOUNTER — Encounter (INDEPENDENT_AMBULATORY_CARE_PROVIDER_SITE_OTHER): Payer: Self-pay | Admitting: *Deleted

## 2010-04-16 ENCOUNTER — Encounter (INDEPENDENT_AMBULATORY_CARE_PROVIDER_SITE_OTHER): Payer: Self-pay | Admitting: *Deleted

## 2010-05-05 ENCOUNTER — Ambulatory Visit
Admission: RE | Admit: 2010-05-05 | Discharge: 2010-05-05 | Payer: Self-pay | Source: Home / Self Care | Attending: Internal Medicine | Admitting: Internal Medicine

## 2010-05-05 ENCOUNTER — Encounter: Payer: Self-pay | Admitting: Internal Medicine

## 2010-05-05 LAB — CONVERTED CEMR LAB
Alkaline Phosphatase: 65 units/L (ref 39–117)
Basophils Relative: 1 % (ref 0–1)
CO2: 29 meq/L (ref 19–32)
Creatinine, Ser: 1.27 mg/dL (ref 0.40–1.50)
Eosinophils Absolute: 0.2 10*3/uL (ref 0.0–0.7)
Eosinophils Relative: 2 % (ref 0–5)
Glucose, Bld: 92 mg/dL (ref 70–99)
HCT: 40.4 % (ref 39.0–52.0)
Lymphs Abs: 2.7 10*3/uL (ref 0.7–4.0)
MCHC: 32.9 g/dL (ref 30.0–36.0)
MCV: 87.8 fL (ref 78.0–100.0)
Monocytes Absolute: 0.9 10*3/uL (ref 0.1–1.0)
Monocytes Relative: 11 % (ref 3–12)
RBC: 4.6 M/uL (ref 4.22–5.81)
Sodium: 142 meq/L (ref 135–145)
Total Bilirubin: 0.4 mg/dL (ref 0.3–1.2)
Total Protein: 7.5 g/dL (ref 6.0–8.3)
WBC: 8.5 10*3/uL (ref 4.0–10.5)

## 2010-05-11 LAB — T-HELPER CELL (CD4) - (RCID CLINIC ONLY)
CD4 % Helper T Cell: 40 % (ref 33–55)
CD4 T Cell Abs: 1160 uL (ref 400–2700)

## 2010-05-25 ENCOUNTER — Ambulatory Visit
Admission: RE | Admit: 2010-05-25 | Discharge: 2010-05-25 | Payer: Self-pay | Source: Home / Self Care | Attending: Internal Medicine | Admitting: Internal Medicine

## 2010-05-26 NOTE — Miscellaneous (Signed)
Summary: Orders Update  Clinical Lists Changes  Problems: Added new problem of HYPOKALEMIA (ICD-276.8) Orders: Added new Test order of T-Basic Metabolic Panel [phone]) - Signed Added new Test order of T-Phosphorus [phone]) - Signed     Process Orders Check Orders Results:     [facility]: ABN not required for this insurance Order queued for requisitioning for [facility]: July 17, 2009 2:30 PM  Tests Sent for requisitioning (July 17, 2009 2:30 PM):     07/17/2009: [facility] -- T-Basic Metabolic Panel [phone] (signed)     07/17/2009: [facility] -- T-Phosphorus [phone] (signed)

## 2010-05-26 NOTE — Assessment & Plan Note (Signed)
Summary: f/u labs-TY   CC:  f.u ov   No missed doses of HAART per pt .  History of Present Illness: Pt is here for f/u.  Had a Gastroenteritis about 3 weeks ago but feels better now. Had a few intermittant migraines which he treats with oxygen.  Preventive Screening-Counseling & Management  Alcohol-Tobacco     Alcohol drinks/day: 0     Alcohol type: WINE /AT TIMES     Smoking Status: quit     Smoking Cessation Counseling: yes     Year Started: AT THE AGE OF 18     Year Quit: 12-10     Passive Smoke Exposure: no   Updated Prior Medication List: ATRIPLA 600-200-300 MG TABS (EFAVIRENZ-EMTRICITAB-TENOFOVIR) Take 1 tablet by mouth at bedtime PERCOCET 5-325 MG  TABS (OXYCODONE-ACETAMINOPHEN) Take 1 tablet by mouth every 6 hours prn PRILOSEC 20 MG  CPDR (OMEPRAZOLE) Take 1 tablet by mouth once a day  Current Allergies (reviewed today): No known allergies  Past History:  Past Medical History: Last updated: 04/29/2006 HIV disease Headache  Review of Systems  The patient denies anorexia, fever, and weight loss.    Vital Signs:  Patient profile:   30 year old male Height:      72 inches Weight:      219.1 pounds BMI:     29.82 Temp:     97.6 degrees F oral Pulse rate:   70 / minute BP sitting:   131 / 74  (left arm)  Vitals Entered By: Tomasita Morrow RN (June 04, 2009 3:29 PM) CC: f.u ov   No missed doses of HAART per pt  Is Patient Diabetic? No Pain Assessment Patient in pain? yes     Location: head Intensity: 8 Type: aching Onset of pain  X 2 weeks returned  Nutritional Status BMI of 25 - 29 = overweight Nutritional Status Detail normal   Have you ever been in a relationship where you felt threatened, hurt or afraid?No  Domestic Violence Intervention none   Physical Exam  General:  alert, well-developed, well-nourished, and well-hydrated.   Head:  normocephalic and atraumatic.   Mouth:  no thrush  Lungs:  normal breath sounds.      Impression &  Recommendations:  Problem # 1:  HIV DISEASE (ICD-042) Pt.s most recent CD4ct was 930 and VL 75 .  Pt instructed to continue the current antiretroviral regimen.  Pt encouraged to take medication regularly and not miss doses.  Pt will f/u in 3 months for repeat blood work and will see me 2 weeks later.  Diagnostics Reviewed:  HIV: CDC-defined AIDS (05/28/2009)   CD4: 930 (05/09/2009)   CD4 %: 33 (07/04/2006) WBC: 11.0 (05/08/2009)   Hgb: 16.2 (05/08/2009)   HCT: 46.9 (05/08/2009)   Platelets: 342 (05/08/2009) HIV-1 RNA: 75 (05/08/2009)   HBSAg: NO (06/20/2006)  Problem # 2:  HEADACHE, CLUSTER (ICD-346.20) continue O2 therapy and as needed percocet. His updated medication list for this problem includes:    Percocet 5-325 Mg Tabs (Oxycodone-acetaminophen) .Marland Kitchen... Take 1 tablet by mouth every 6 hours prn  Other Orders: Est. Patient Level III (27253) Future Orders: T-CD4SP ([facility]) (CD4SP) ... 09/02/2009 T-HIV Viral Load [phone]) ... 09/02/2009 T-Comprehensive Metabolic Panel [id number]) ... 09/02/2009 T-CBC w/Diff ([id number]) ... 09/02/2009  Patient Instructions: 1)  Please schedule a follow-up appointment in 3 months, 2 weeks after labs.  Prescriptions: PERCOCET 5-325 MG  TABS (OXYCODONE-ACETAMINOPHEN) Take 1 tablet by mouth every 6 hours prn  #60 x  0   Entered and Authorized by:   Yisroel Ramming MD   Signed by:   Yisroel Ramming MD on 06/04/2009   Method used:   Print then Give to Patient   RxID:   [id number]

## 2010-05-26 NOTE — Progress Notes (Signed)
Summary: Patient approved via PAP for topamax  Phone Note Call from Patient   Caller: Patient (walkin) Reason for Call: Refill Medication Summary of Call: Patient came to pickup his Chantix.  He also wanted to know if his Topamax was here.  I asked him if he received a letter and card in the mail and he said yes.  I told him then I will need to call in a new prescription so he can get it using the card that was mailed.  He said to call it into [facility] Initial call taken by: Paulo Fruit  BS,CPht II,MPH,  August 12, 2009 3:22 PM    Prescriptions: TOPAMAX 25 MG TABS (TOPIRAMATE) Take 1 tablet by mouth at bedtime  #30 x 5   Entered by:   Paulo Fruit  BS,CPht II,MPH   Authorized by:   Yisroel Ramming MD   Signed by:   Paulo Fruit  BS,CPht II,MPH on 08/12/2009   Method used:   Electronically to        [id number] (retail)       [address] [place], [place]  [address]       Ph: [phone]       Fax: [phone]   RxID:   [id number]  Paulo Fruit  BS,CPht II,MPH  August 12, 2009 3:23 PM

## 2010-05-26 NOTE — Assessment & Plan Note (Signed)
Summary: to discuss new option for pain meds/tkk   CC:  nausea, vomiting some mornings, and and dizziness x 1 month.  History of Present Illness: Pt c/o feeling light headed in the AM with some associated nausea and vomiting for the past month. No abdominal pain. No diarrhea.  No change in diet or medication. He tried taking prilosec but it did not help.He has also been getting more HAs. He would like to try topamax for his migraines. No visual changes, neck stiffness or fever.  He started smoking again and would like to get back on chantix.  Preventive Screening-Counseling & Management  Alcohol-Tobacco     Alcohol drinks/day: 0     Alcohol type: WINE /AT TIMES     Smoking Status: current     Smoking Cessation Counseling: yes     Packs/Day: <0.25     Year Started: AT THE AGE OF 18     Year Quit: 12-10     Passive Smoke Exposure: no  Caffeine-Diet-Exercise     Caffeine use/day: sodas and tea occassionally     Does Patient Exercise: yes     Type of exercise: gym membership     Exercise (avg: min/session): 30-60     Times/week: 4  Hep-HIV-STD-Contraception     HIV Risk: no     HIV Risk Counseling: 03/25/2006  Safety-Violence-Falls     Seat Belt Use: yes   Updated Prior Medication List: ATRIPLA 600-200-300 MG TABS (EFAVIRENZ-EMTRICITAB-TENOFOVIR) Take 1 tablet by mouth at bedtime PERCOCET 5-325 MG  TABS (OXYCODONE-ACETAMINOPHEN) Take 1 tablet by mouth every 6 hours prn PRILOSEC 20 MG  CPDR (OMEPRAZOLE) Take 1 tablet by mouth once a day PROMETHAZINE HCL 25 MG TABS (PROMETHAZINE HCL) Take 1 tablet by mouth every 8 hours as needed TOPAMAX 25 MG TABS (TOPIRAMATE) Take 1 tablet by mouth at bedtime CHANTIX STARTING MONTH PAK 0.5 MG X 11 & 1 MG X 42 TABS (VARENICLINE TARTRATE) take as directed  Current Allergies (reviewed today): No known allergies  Past History:  Past Medical History: Last updated: 04/29/2006 HIV disease Headache  Review of Systems       The patient  complains of anorexia.  The patient denies fever, abdominal pain, melena, hematochezia, and severe indigestion/heartburn.    Vital Signs:  Patient profile:   30 year old male Height:      72 inches (182.88 cm) Weight:      202.1 pounds (91.86 kg) BMI:     27.51 Temp:     98.3 degrees F (36.83 degrees C) oral Pulse rate:   76 / minute BP sitting:   116 / 76  (left arm)  Vitals Entered By: Wendall Mola CMA Duncan Dull) (July 16, 2009 2:36 PM) CC: nausea, vomiting some mornings, and dizziness x 1 month Is Patient Diabetic? No Pain Assessment Patient in pain? no      Nutritional Status BMI of 25 - 29 = overweight Nutritional Status Detail appetite "so-so"  Does patient need assistance? Functional Status Self care Ambulation Normal Comments no missed doses of meds per patient   Physical Exam  General:  alert, well-developed, well-nourished, and well-hydrated.   Head:  normocephalic and atraumatic.   Mouth:  pharynx pink and moist.   Neck:  supple and full ROM.   Lungs:  normal breath sounds.   Abdomen:  soft, non-tender, normal bowel sounds, and no masses.      Impression & Recommendations:  Problem # 1:  NAUSEA AND VOMITING (ICD-787.01) Will check LFTs  and h.pylori. Phenergan for symptomatic relief. continue prilosec. Orders: T-Comprehensive Metabolic Panel ([id number]) T-Helicobacter AB - IgG ([id number]) Est. Patient Level III (84696)  Problem # 2:  HEADACHE, CLUSTER (ICD-346.20) trial of topamax 25mg  at bedtime. His updated medication list for this problem includes:    Percocet 5-325 Mg Tabs (Oxycodone-acetaminophen) .Marland Kitchen... Take 1 tablet by mouth every 6 hours prn  Problem # 3:  HX, PERSONAL, TOBACCO USE (ICD-V15.82) re-start chantix.  Problem # 4:  HIV DISEASE (ICD-042) Continue Atripla and f/u as scheduled. Diagnostics Reviewed:  HIV: CDC-defined AIDS (05/28/2009)   CD4: 930 (05/09/2009)   CD4 %: 33 (07/04/2006) WBC: 11.0 (05/08/2009)   Hgb: 16.2  (05/08/2009)   HCT: 46.9 (05/08/2009)   Platelets: 342 (05/08/2009) HIV-1 RNA: 75 (05/08/2009)   HBSAg: NO (06/20/2006)  Medications Added to Medication List This Visit: 1)  Promethazine Hcl 25 Mg Tabs (Promethazine hcl) .... Take 1 tablet by mouth every 8 hours as needed 2)  Topamax 25 Mg Tabs (Topiramate) .... Take 1 tablet by mouth at bedtime 3)  Chantix Starting Month Pak 0.5 Mg X 11 & 1 Mg X 42 Tabs (Varenicline tartrate) .... Take as directed  Patient Instructions: 1)  follow-up as scheduled Prescriptions: CHANTIX STARTING MONTH PAK 0.5 MG X 11 & 1 MG X 42 TABS (VARENICLINE TARTRATE) take as directed  #1 pack x 0   Entered and Authorized by:   Yisroel Ramming MD   Signed by:   Yisroel Ramming MD on 07/16/2009   Method used:   Print then Give to Patient   RxID:   [id number] TOPAMAX 25 MG TABS (TOPIRAMATE) Take 1 tablet by mouth at bedtime  #30 x 5   Entered and Authorized by:   Yisroel Ramming MD   Signed by:   Yisroel Ramming MD on 07/16/2009   Method used:   Print then Give to Patient   RxID:   [id number] PROMETHAZINE HCL 25 MG TABS (PROMETHAZINE HCL) Take 1 tablet by mouth every 8 hours as needed  #30 x 0   Entered and Authorized by:   Yisroel Ramming MD   Signed by:   Yisroel Ramming MD on 07/16/2009   Method used:   Print then Give to Patient   RxID:   [id number]  Process Orders Check Orders Results:     [facility]: ABN not required for this insurance Tests Sent for requisitioning (July 16, 2009 3:58 PM):     07/16/2009: [facility] -- T-Comprehensive Metabolic Panel [[id number]] (signed)     07/16/2009: [facility] -- T-Helicobacter AB - IgG [id number] (signed)

## 2010-05-26 NOTE — Miscellaneous (Signed)
Summary: app for chantix thru [employer],  receiving drug  Clinical Lists Changes app for. 06/14/08 came back stating they needed proof of income. there was a 2009  tuition statement attached & another app dated 07/05/08. pt is still using the med. still getting thru [employer] or another source?Marland KitchenMarland KitchenMarland KitchenGolden Circle RN  February 12, 2010 10:25 AM  [facility]. Jennet Maduro RN  February 16, 2010 8:56 AM

## 2010-05-26 NOTE — Miscellaneous (Signed)
Summary: clinical update/ryan white NCADAP appr til 07/25/10  Clinical Lists Changes  Observations: Added new observation of AIDSDAP: Yes 2011 (07/01/2009 10:21)

## 2010-05-26 NOTE — Progress Notes (Signed)
Summary: chantix arrived from [employer]/TY  Phone Note Outgoing Call   Call placed by: Starleen Arms CMA,  February 13, 2010 4:19 PM Call placed to: Patient Summary of Call: Chantix arrived from [employer] patient assistance program. Patient aware. Initial call taken by: Starleen Arms CMA,  February 13, 2010 4:20 PM

## 2010-05-26 NOTE — Assessment & Plan Note (Signed)
Summary: f/u [mkj]   CC:  follow-up visit, lab results, and refill Chantix and Topamax.  History of Present Illness: Pt has not had any HAs since being on topamax.  He wouldlike a refill. He started smoking again and would like to get back on chantix. It helped in the past. He has been taking his Atripla every day.  Preventive Screening-Counseling & Management  Alcohol-Tobacco     Alcohol drinks/day: 0     Alcohol type: WINE /AT TIMES     Smoking Status: current     Smoking Cessation Counseling: yes     Packs/Day: 0.5     Year Started: AT THE AGE OF 18     Year Quit: 12-10     Passive Smoke Exposure: no  Caffeine-Diet-Exercise     Caffeine use/day: sodas and tea occassionally     Does Patient Exercise: yes     Type of exercise: gym membership     Exercise (avg: min/session): 30-60     Times/week: 4  Hep-HIV-STD-Contraception     HIV Risk: no     HIV Risk Counseling: 03/25/2006  Safety-Violence-Falls     Seat Belt Use: yes      Sexual History:  currently monogamous.        Drug Use:  never and no.    Comments: pt. declined condom   Updated Prior Medication List: ATRIPLA 600-200-300 MG TABS (EFAVIRENZ-EMTRICITAB-TENOFOVIR) Take 1 tablet by mouth at bedtime PRILOSEC 20 MG  CPDR (OMEPRAZOLE) Take 1 tablet by mouth once a day PROMETHAZINE HCL 25 MG TABS (PROMETHAZINE HCL) Take 1 tablet by mouth every 8 hours as needed TOPAMAX 25 MG TABS (TOPIRAMATE) Take 1 tablet by mouth at bedtime POTASSIUM CHLORIDE 20 MEQ PACK (POTASSIUM CHLORIDE) Take 1 tablet by mouth once a day CHANTIX CONTINUING MONTH PAK 1 MG TABS (VARENICLINE TARTRATE) take as directed CHANTIX STARTING MONTH PAK 0.5 MG X 11 & 1 MG X 42 TABS (VARENICLINE TARTRATE) take as directed  Current Allergies (reviewed today): No known allergies  Past History:  Past Medical History: Last updated: 04/29/2006 HIV disease Headache  Social History: Drug Use:  never, no Sexual History:  currently  monogamous  Review of Systems  The patient denies anorexia, fever, and weight loss.    Vital Signs:  Patient profile:   30 year old male Height:      72 inches (182.88 cm) Weight:      188.2 pounds (85.55 kg) BMI:     25.62 Temp:     98.5 degrees F (36.94 degrees C) oral Pulse rate:   72 / minute BP sitting:   118 / 66  (right arm)  Vitals Entered By: Wendall Mola CMA Duncan Dull) (January 15, 2010 2:59 PM) CC: follow-up visit, lab results, refill Chantix and Topamax Is Patient Diabetic? No Pain Assessment Patient in pain? no      Nutritional Status BMI of 25 - 29 = overweight Nutritional Status Detail appetite "good"  Does patient need assistance? Functional Status Self care Ambulation Normal Comments no missed doses of Aripla per pt.   Physical Exam  General:  alert, well-developed, well-nourished, and well-hydrated.   Head:  normocephalic and atraumatic.   Mouth:  pharynx pink and moist.  no thrush  Lungs:  normal breath sounds.     Impression & Recommendations:  Problem # 1:  HIV DISEASE (ICD-042) Pt.s most recent CD4ct was 1120 and VL 91 .  Pt instructed to continue the current antiretroviral regimen.  Pt encouraged to  take medication regularly and not miss doeses.  Pt will f/u in 3 months for repeat blood work and will see me 2 weeks later. Influenza vaccine given.  Diagnostics Reviewed:  HIV: CDC-defined AIDS (05/28/2009)   CD4: 1120 (01/01/2010)   CD4 %: 33 (07/04/2006) WBC: 9.0 (12/31/2009)   Hgb: 14.8 (12/31/2009)   HCT: 44.1 (12/31/2009)   Platelets: 283 (12/31/2009) HIV-1 RNA: 91 (12/31/2009)   HBSAg: NO (06/20/2006)  Problem # 2:  HX, PERSONAL, TOBACCO USE (ICD-V15.82) will place back on chantix  Problem # 3:  HEADACHE, CLUSTER (ICD-346.20)  on topamax and doing well The following medications were removed from the medication list:    Percocet 5-325 Mg Tabs (Oxycodone-acetaminophen) .Marland Kitchen... Take 1 tablet by mouth every 6 hours prn  The  following medications were removed from the medication list:    Percocet 5-325 Mg Tabs (Oxycodone-acetaminophen) .Marland Kitchen... Take 1 tablet by mouth every 6 hours prn  Medications Added to Medication List This Visit: 1)  Chantix Starting Month Pak 0.5 Mg X 11 & 1 Mg X 42 Tabs (Varenicline tartrate) .... Take as directed  Other Orders: Est. Patient Level III (22025) Influenza Vaccine NON MCR (42706) Pneumococcal Vaccine (23762) Admin 1st Vaccine (83151) Future Orders: T-CD4SP ([facility]) (CD4SP) ... 04/15/2010 T-HIV Viral Load [phone]) ... 04/15/2010 T-Comprehensive Metabolic Panel [phone]) ... 04/15/2010 T-CBC w/Diff ([id number]) ... 04/15/2010  Patient Instructions: 1)  Please schedule a follow-up appointment in 3 months, 2 weeks after labs.  Prescriptions: CHANTIX STARTING MONTH PAK 0.5 MG X 11 & 1 MG X 42 TABS (VARENICLINE TARTRATE) take as directed  #1 x 0   Entered and Authorized by:   Yisroel Ramming MD   Signed by:   Yisroel Ramming MD on 01/15/2010   Method used:   Print then Give to Patient   RxID:   [id number] TOPAMAX 25 MG TABS (TOPIRAMATE) Take 1 tablet by mouth at bedtime  #30 x 5   Entered and Authorized by:   Yisroel Ramming MD   Signed by:   Yisroel Ramming MD on 01/15/2010   Method used:   Print then Give to Patient   RxID:   [id number]      Immunizations Administered:  Influenza Vaccine # 1:    Vaccine Type: Fluvax Non-MCR    Site: right deltoid    Mfr: Novartis    Dose: 0.5 ml    Route: IM    Given by: Wendall Mola CMA ( AAMA)    Exp. Date: 07/26/2010    Lot #: [id number]    VIS given: 11/17/06 version given January 15, 2010.  Pneumonia Vaccine:    Vaccine Type: Pneumovax    Site: left deltoid    Mfr: Merck    Dose: 0.5 ml    Route: IM    Given by: Wendall Mola CMA ( AAMA)    Exp. Date: 07/11/2011    Lot #: [id number]    VIS given: 11/22/95 version given January 15, 2010.  Flu Vaccine Consent Questions:    Do you have a  history of severe allergic reactions to this vaccine? no    Any prior history of allergic reactions to egg and/or gelatin? no    Do you have a sensitivity to the preservative Thimersol? no    Do you have a past history of Guillan-Barre Syndrome? no    Do you currently have an acute febrile illness? no    Have you ever had a severe reaction to latex? no    Vaccine  information given and explained to patient? yes

## 2010-05-26 NOTE — Miscellaneous (Signed)
Summary: clinical update/ryan white NCADAP completed  Clinical Lists Changes  Observations: Added new observation of PCTFPL: 76.32  (06/12/2009 9:41) Added new observation of INCOMESOURCE: WAGES  (06/12/2009 9:41) Added new observation of HOUSEINCOME: 8265  (06/12/2009 9:41) Added new observation of FINASSESSDT: 06/04/2009  (06/12/2009 9:41)

## 2010-05-26 NOTE — Progress Notes (Signed)
Summary: Pt assist med arrived a one month supply  Phone Note Refill Request      Prescriptions: CHANTIX STARTING MONTH PAK 0.5 MG X 11 & 1 MG X 42 TABS (VARENICLINE TARTRATE) take as directed  #1 pack x 0   Entered by:   Paulo Fruit  BS,CPht II,MPH   Authorized by:   Yisroel Ramming MD   Signed by:   Paulo Fruit  BS,CPht II,MPH on 07/29/2009   Method used:   Samples Given   RxID:   [id number]   Patient Assist Medication Verification: Medication: Chantix Starter month pak Lot# [id number] Exp Date:03 2012 Tech approval:MLD Call placed to patient with message that assistance medications are ready for pick-up. Paulo Fruit  BS,CPht II,MPH  July 29, 2009 4:35 PM

## 2010-05-26 NOTE — Assessment & Plan Note (Signed)
Summary: 52month f/u /vs   CC:  follow-up visit, pt. c/o fatigue, and low energy (feels his potassium may be low).  History of Present Illness: Pt c/o feeling weak like his potassium is low.  He would like a refill on that. No cramps. He states that te topamax has really helped his migraines.  He needs a note for work so he can wear tennis shoes to support his back.  He has quit smoking. No missed doses of his Atripla.  Preventive Screening-Counseling & Management  Alcohol-Tobacco     Alcohol drinks/day: 0     Alcohol type: WINE /AT TIMES     Smoking Status: quit     Smoking Cessation Counseling: yes     Packs/Day: <0.25     Year Started: AT THE AGE OF 18     Year Quit: 12-10     Passive Smoke Exposure: no  Caffeine-Diet-Exercise     Caffeine use/day: sodas and tea occassionally     Does Patient Exercise: yes     Type of exercise: gym membership     Exercise (avg: min/session): 30-60     Times/week: 4  Hep-HIV-STD-Contraception     HIV Risk: no     HIV Risk Counseling: 03/25/2006  Safety-Violence-Falls     Seat Belt Use: yes      Drug Use:  no.    Comments: pt. declined condoms   Updated Prior Medication List: ATRIPLA 600-200-300 MG TABS (EFAVIRENZ-EMTRICITAB-TENOFOVIR) Take 1 tablet by mouth at bedtime PERCOCET 5-325 MG  TABS (OXYCODONE-ACETAMINOPHEN) Take 1 tablet by mouth every 6 hours prn PRILOSEC 20 MG  CPDR (OMEPRAZOLE) Take 1 tablet by mouth once a day PROMETHAZINE HCL 25 MG TABS (PROMETHAZINE HCL) Take 1 tablet by mouth every 8 hours as needed TOPAMAX 25 MG TABS (TOPIRAMATE) Take 1 tablet by mouth at bedtime POTASSIUM CHLORIDE 20 MEQ PACK (POTASSIUM CHLORIDE) Take 1 tablet by mouth once a day CHANTIX CONTINUING MONTH PAK 1 MG TABS (VARENICLINE TARTRATE) take as directed  Current Allergies (reviewed today): No known allergies  Past History:  Past Medical History: Last updated: 04/29/2006 HIV disease Headache  Review of Systems  The patient denies  anorexia, fever, abdominal pain, and severe indigestion/heartburn.    Vital Signs:  Patient profile:   30 year old male Height:      72 inches (182.88 cm) Weight:      188.12 pounds (85.51 kg) BMI:     25.61 Temp:     98.7 degrees F (37.06 degrees C) oral Pulse rate:   91 / minute BP sitting:   114 / 79  (left arm)  Vitals Entered By: Wendall Mola CMA Duncan Dull) (Sep 16, 2009 3:13 PM) CC: follow-up visit, pt. c/o fatigue, low energy (feels his potassium may be low) Is Patient Diabetic? No Pain Assessment Patient in pain? no      Nutritional Status BMI of 25 - 29 = overweight Nutritional Status Detail appetite "low"  Does patient need assistance? Functional Status Self care Ambulation Normal Comments no missed doses  of meds per patient   Physical Exam  General:  alert, well-developed, well-nourished, and well-hydrated.   Head:  normocephalic and atraumatic.   Mouth:  pharynx pink and moist.   Lungs:  normal breath sounds.      Impression & Recommendations:  Problem # 1:  HIV DISEASE (ICD-042) Pt.s most recent CD4ct was 870 and VL <48 .  Pt instructed to continue the current antiretroviral regimen.  Pt encouraged to take  medication regularly and not miss doses.  Pt will f/u in 3 months for repeat blood work and will see me 2 weeks later.  Diagnostics Reviewed:  HIV: CDC-defined AIDS (05/28/2009)   CD4: 870 (09/05/2009)   CD4 %: 33 (07/04/2006) WBC: 9.1 (09/03/2009)   Hgb: 14.9 (09/03/2009)   HCT: 44.9 (09/03/2009)   Platelets: 331 (09/03/2009) HIV-1 RNA: <48 copies/mL (09/03/2009)   HBSAg: NO (06/20/2006)  Problem # 2:  HYPOKALEMIA (ICD-276.8) refill KCl Rx  Problem # 3:  HEADACHE, CLUSTER (ICD-346.20) Assessment: Improved on topamax His updated medication list for this problem includes:    Percocet 5-325 Mg Tabs (Oxycodone-acetaminophen) .Marland Kitchen... Take 1 tablet by mouth every 6 hours prn  Problem # 4:  HX, PERSONAL, TOBACCO USE (ICD-V15.82) pt has not smoked  for 2 weeks would like maintenance pack of chantix  Medications Added to Medication List This Visit: 1)  Potassium Chloride 20 Meq Pack (Potassium chloride) .... Take 1 tablet by mouth once a day 2)  Chantix Continuing Month Pak 1 Mg Tabs (Varenicline tartrate) .... Take as directed  Other Orders: Est. Patient Level III (04540) Future Orders: T-CD4SP ([facility]) (CD4SP) ... 12/15/2009 T-HIV Viral Load [phone]) ... 12/15/2009 T-Comprehensive Metabolic Panel [phone]) ... 12/15/2009 T-CBC w/Diff ([id number]) ... 12/15/2009 T-Lipid Profile [phone]) ... 12/15/2009  Patient Instructions: 1)  Please schedule a follow-up appointment in 3 months, 2 weeks after labs.  Prescriptions: POTASSIUM CHLORIDE 20 MEQ PACK (POTASSIUM CHLORIDE) Take 1 tablet by mouth once a day  #30 x 5   Entered and Authorized by:   Yisroel Ramming MD   Signed by:   Yisroel Ramming MD on 09/16/2009   Method used:   Print then Give to Patient   RxID:   [id number]

## 2010-05-26 NOTE — Miscellaneous (Signed)
Summary: RW Update  Clinical Lists Changes  Observations: Added new observation of HIV STATUS: CDC-defined AIDS (05/28/2009 16:40)

## 2010-05-26 NOTE — Assessment & Plan Note (Signed)
Summary: f/u OV /tkk    Current Allergies: No known allergies   Other Orders: No Charge Patient Arrived (NCPA0) (NCPA0)

## 2010-05-26 NOTE — Progress Notes (Signed)
Summary: rx for percocet  Phone Note Call from Patient   Caller: Patient Call For: Yisroel Ramming MD Summary of Call: Patient wants to refill percocet and topamax Initial call taken by: Tomasita Morrow RN,  August 06, 2009 11:31 AM  Follow-up for Phone Call        he should have refills on topamax ok x 1 on percocet Follow-up by: Yisroel Ramming MD,  August 06, 2009 2:25 PM    Prescriptions: PERCOCET 5-325 MG  TABS (OXYCODONE-ACETAMINOPHEN) Take 1 tablet by mouth every 6 hours prn  #60 x 0   Entered by:   Tomasita Morrow RN   Authorized by:   Yisroel Ramming MD   Signed by:   Tomasita Morrow RN on 08/07/2009   Method used:   Print then Give to Patient   RxID:   [id number]

## 2010-05-26 NOTE — Progress Notes (Signed)
Summary: requesting pain med/TY  Phone Note Call from Patient   Caller: Patient Summary of Call: Patient requesting pain medication, last p/u on 04/02/2009. Initial call taken by: Starleen Arms CMA,  May 05, 2009 10:52 AM  Follow-up for Phone Call        ok x 1 Follow-up by: Yisroel Ramming MD,  May 05, 2009 11:16 AM  Additional Follow-up for Phone Call Additional follow up Details #1::        ok Additional Follow-up by: Starleen Arms CMA,  May 05, 2009 11:45 AM    Prescriptions: PERCOCET 5-325 MG  TABS (OXYCODONE-ACETAMINOPHEN) Take 1 tablet by mouth every 6 hours prn  #60 x 0   Entered by:   Starleen Arms CMA   Authorized by:   Yisroel Ramming MD   Signed by:   Starleen Arms CMA on 05/05/2009   Method used:   Print then Give to Patient   RxID:   [id number]

## 2010-05-26 NOTE — Progress Notes (Signed)
Summary: Percocet refill request   Phone Note Call from Patient   Summary of Call: Pt called for Pain med request. Script placed in  physician's box for signature . Tomasita Morrow RN  July 07, 2009 11:45 AM     Prescriptions: PERCOCET 5-325 MG  TABS (OXYCODONE-ACETAMINOPHEN) Take 1 tablet by mouth every 6 hours prn  #60 x 0   Entered by:   Tomasita Morrow RN   Authorized by:   Yisroel Ramming MD   Signed by:   Tomasita Morrow RN on 07/07/2009   Method used:   Print then Give to Patient   RxID:   [id number]

## 2010-05-26 NOTE — Letter (Signed)
Summary: Connection To Care: RX  Connection To Care: RX   Imported By: Florinda Marker 02/25/2010 15:17:31  _____________________________________________________________________  External Attachment:    Type:   Image     Comment:   External Document

## 2010-05-28 NOTE — Miscellaneous (Signed)
  Clinical Lists Changes  Observations: Added new observation of YEARAIDSPOS: 2006  (04/08/2010 11:30)

## 2010-05-28 NOTE — Miscellaneous (Signed)
  Clinical Lists Changes  Orders: Added new Test order of T-CBC w/Diff [id number]) - Signed Added new Test order of T-CD4SP [facility]) (CD4SP) - Signed Added new Test order of T-Comprehensive Metabolic Panel [id number]) - Signed Added new Test order of T-HIV Viral Load [id number]) - Signed     Process Orders Check Orders Results:     [facility]: ABN not required for this insurance Tests Sent for requisitioning (April 16, 2010 4:53 PM):     04/16/2010: [facility] -- T-CBC w/Diff [[id number]] (signed)     04/16/2010: [facility] -- T-Comprehensive Metabolic Panel [[id number]] (signed)     04/16/2010: [facility] -- T-HIV Viral Load [phone] (signed)

## 2010-06-03 NOTE — Assessment & Plan Note (Signed)
Summary: followup on labs/jc   CC:  follow-up visit, lab results, refill Topamax, and c/o cough for two months.  History of Present Illness: 30 yo M with hx of HIV+ 01-24-05, started on atripla around that time. He also has a hx of migraine headache, tobacco use and rectal condyloma.  Last CD4 1160 and Vl <20 (05-05-10). No problems with atripla. Feeling well, eating well, moving bowels well. Needs rx for topamax. had "cold in november" still has mild cough. No fevers or chills. Non-productive cough. Rare sinus d/c. no reflux symptoms.   Preventive Screening-Counseling & Management  Alcohol-Tobacco     Alcohol drinks/day: 0     Alcohol type: WINE /AT TIMES     Smoking Status: quit     Smoking Cessation Counseling: yes     Packs/Day: 0.5     Year Started: AT THE AGE OF 18     Year Quit: 12-10     Passive Smoke Exposure: no  Caffeine-Diet-Exercise     Caffeine use/day: sodas and tea occassionally     Does Patient Exercise: yes     Type of exercise: gym membership     Exercise (avg: min/session): 30-60     Times/week: 4  Hep-HIV-STD-Contraception     HIV Risk: no     HIV Risk Counseling: 03/25/2006  Safety-Violence-Falls     Seat Belt Use: yes      Sexual History:  currently monogamous.        Drug Use:  no.    Comments: pt. declined condoms   Updated Prior Medication List: ATRIPLA 600-200-300 MG TABS (EFAVIRENZ-EMTRICITAB-TENOFOVIR) Take 1 tablet by mouth at bedtime PRILOSEC 20 MG  CPDR (OMEPRAZOLE) Take 1 tablet by mouth once a day TOPAMAX 25 MG TABS (TOPIRAMATE) Take 1 tablet by mouth at bedtime POTASSIUM CHLORIDE 20 MEQ PACK (POTASSIUM CHLORIDE) Take 1 tablet by mouth once a day CHANTIX CONTINUING MONTH PAK 1 MG TABS (VARENICLINE TARTRATE) take as directed CHANTIX STARTING MONTH PAK 0.5 MG X 11 & 1 MG X 42 TABS (VARENICLINE TARTRATE) take as directed  Current Allergies (reviewed today): No known allergies  Family History: DM (both sides grandparents), HTN.    Family History of CAD Male 1st degree relative <50, father also had CVA.   Social History: Former Smoker Alcohol use-yes- rare Drug use-no Drug Use:  no  Vital Signs:  Patient profile:   30 year old male Height:      72 inches (182.88 cm) Weight:      185.8 pounds (84.45 kg) BMI:     25.29 Temp:     97.9 degrees F (36.61 degrees C) oral Pulse rate:   93 / minute BP sitting:   118 / 81  (right arm)  Vitals Entered By: Wendall Mola CMA Duncan Dull) (May 25, 2010 2:47 PM) CC: follow-up visit, lab results, refill Topamax, c/o cough for two months Is Patient Diabetic? No Pain Assessment Patient in pain? no      Nutritional Status BMI of 25 - 29 = overweight Nutritional Status Detail appetite "good"  Have you ever been in a relationship where you felt threatened, hurt or afraid?No   Does patient need assistance? Functional Status Self care Ambulation Normal Comments no missed doses of Atripla since last visit per pt.   Physical Exam  General:  well-developed, well-nourished, and well-hydrated.   Eyes:  pupils equal, pupils round, and pupils reactive to light.   Mouth:  pharynx pink and moist and no exudates.   Neck:  no masses.   Lungs:  normal respiratory effort and normal breath sounds.   Heart:  normal rate, regular rhythm, and no murmur.   Abdomen:  soft, non-tender, and normal bowel sounds.     Impression & Recommendations:  Problem # 1:  HIV DISEASE (ICD-042) he is doing very well. Will cont him on his atripla. He has quit smoking. he is offered condoms. His vaccinces are uptodate. WIll see him back in 6 months with labs prior.   Problem # 2:  HEADACHE (ICD-784.0) have refilled his topamax. will cont to watch.   Problem # 3:  CONDYLOMA ACUMINATUM (ICD-078.11) have resolved since he had surgery. will cont to be available.   Other Orders: Est. Patient Level IV (11914) Future Orders: T-CD4SP ([facility]) (CD4SP) ... 08/23/2010 T-HIV Viral Load  [id number]) ... 08/23/2010 T-Comprehensive Metabolic Panel [id number]) ... 08/23/2010 T-CBC w/Diff ([id number]) ... 08/23/2010 T-RPR (Syphilis) [id number]) ... 08/23/2010 T-Lipid Profile [phone]) ... 08/23/2010  Prescriptions: TOPAMAX 25 MG TABS (TOPIRAMATE) Take 1 tablet by mouth at bedtime  #30 x 3   Entered and Authorized by:   Johny Sax MD   Signed by:   Johny Sax MD on 05/25/2010   Method used:   Electronically to        [facility] [address] [id number]* (retail)       [address]       [place], [place]  [address]       Ph: [phone]       Fax: [phone]   RxID:   [id number]         Medication Adherence: 05/25/2010   Adherence to medications reviewed with patient. Counseling to provide adequate adherence provided   Prevention For Positives: 05/25/2010   Safe sex practices discussed with patient. Condoms offered.

## 2010-07-12 LAB — POCT I-STAT, CHEM 8
BUN: 14 mg/dL (ref 6–23)
Calcium, Ion: 1.01 mmol/L — ABNORMAL LOW (ref 1.12–1.32)
Chloride: 93 mEq/L — ABNORMAL LOW (ref 96–112)
HCT: 51 % (ref 39.0–52.0)
Potassium: 2.9 mEq/L — ABNORMAL LOW (ref 3.5–5.1)
Sodium: 137 mEq/L (ref 135–145)

## 2010-07-12 LAB — T-HELPER CELL (CD4) - (RCID CLINIC ONLY)
CD4 % Helper T Cell: 37 % (ref 33–55)
CD4 T Cell Abs: 930 uL (ref 400–2700)

## 2010-07-13 LAB — COMPREHENSIVE METABOLIC PANEL
ALT: 25 U/L (ref 0–53)
AST: 23 U/L (ref 0–37)
Albumin: 4.9 g/dL (ref 3.5–5.2)
Alkaline Phosphatase: 92 U/L (ref 39–117)
Potassium: 2.8 mEq/L — ABNORMAL LOW (ref 3.5–5.1)
Sodium: 131 mEq/L — ABNORMAL LOW (ref 135–145)
Total Protein: 8.2 g/dL (ref 6.0–8.3)

## 2010-07-13 LAB — CBC
HCT: 45.1 % (ref 39.0–52.0)
Hemoglobin: 15.7 g/dL (ref 13.0–17.0)
MCV: 87.3 fL (ref 78.0–100.0)
Platelets: 281 10*3/uL (ref 150–400)
RBC: 5.16 MIL/uL (ref 4.22–5.81)
WBC: 9.9 10*3/uL (ref 4.0–10.5)

## 2010-07-13 LAB — DIFFERENTIAL
Basophils Relative: 1 % (ref 0–1)
Eosinophils Absolute: 0.1 10*3/uL (ref 0.0–0.7)
Eosinophils Relative: 1 % (ref 0–5)
Monocytes Absolute: 1.2 10*3/uL — ABNORMAL HIGH (ref 0.1–1.0)
Monocytes Relative: 12 % (ref 3–12)

## 2010-07-14 LAB — T-HELPER CELL (CD4) - (RCID CLINIC ONLY): CD4 % Helper T Cell: 39 % (ref 33–55)

## 2010-07-27 ENCOUNTER — Ambulatory Visit: Payer: Self-pay

## 2010-08-03 LAB — T-HELPER CELL (CD4) - (RCID CLINIC ONLY)
CD4 % Helper T Cell: 41 % (ref 33–55)
CD4 T Cell Abs: 960 uL (ref 400–2700)

## 2010-08-07 ENCOUNTER — Other Ambulatory Visit: Payer: Self-pay | Admitting: Infectious Diseases

## 2010-08-07 DIAGNOSIS — G44009 Cluster headache syndrome, unspecified, not intractable: Secondary | ICD-10-CM

## 2010-08-07 MED ORDER — TOPIRAMATE 25 MG PO TABS: 25.0000 mg | ORAL_TABLET | Freq: Every day | ORAL | Status: DC

## 2010-08-10 ENCOUNTER — Telehealth: Payer: Self-pay | Admitting: Infectious Diseases

## 2010-08-10 NOTE — Telephone Encounter (Signed)
Call placed to check status of application with [facility] for Topamax.  Application was received and will take approximately 3 days to process.  Question asked about other State/Government agency listed on application.  The patient had applied for and was denied for ADAP.  Will call back on Wednesday to check status again.Antionette Fairy

## 2010-08-12 ENCOUNTER — Ambulatory Visit (INDEPENDENT_AMBULATORY_CARE_PROVIDER_SITE_OTHER): Payer: Self-pay | Admitting: Adult Health

## 2010-08-12 ENCOUNTER — Encounter: Payer: Self-pay | Admitting: Adult Health

## 2010-08-12 DIAGNOSIS — J309 Allergic rhinitis, unspecified: Secondary | ICD-10-CM

## 2010-08-12 NOTE — Progress Notes (Signed)
Presents to clinic with a two-week history of sinus congestion, severe allergies with watery eyes and a sinus pressure headache unlike his normal cluster headaches, which he has had in the past. The headaches are described as pressure sensation that is focused on both the right and left side where as cluster headaches have been focused on the right side of his head. Only. He denies any fevers, chills, tinnitus, nausea, or vomiting, but symptoms seem to be worse in the middle of the night, which makes him wake up. He denies any known sinus drainage, although he does have a runny nose. Most of the time, especially after he has been outside. He tried taking his Maxalt for headache. At one time and found that it gave him no relief to his symptoms and thus, he attributed this to his allergies more than 2 cluster headaches.  Review of systems  Gen.: Denies any activity change, appetite changes, chills, diaphoresis, fatigue, fever, or expected weight change. HEENT: Denies facial swelling neck pain. Next deafness ear discharge, hearing loss, hearing, pain, tinnitus, nosebleeds, dental problems, drooling, mouth sores, sore throat, trouble swallowing, or voice change. He does have complaints of nasal congestion, rhinorrhea, postnasal drip, sneezing, and sinus pressure. Eyes: Complaints of watery, runny eyes with some irritation. Denies eye pain, photophobia, or visual disturbances. Respiratory: Denies apnea, chest tightness, choking, cough, shortness of breath, stridor, or wheezing. CV: Denies chest pain, leg swelling, or palpitations. GI: Denies abdominal pain, anal bleeding, constipation, nausea, vomiting, or diarrhea. GU: Denies difficulty urinating, dysuria, urinary frequency, flank pain, or decreased urine. MS: Denies arthralgias or myalgias. Neuro: Denies dizziness, facial asymmetry, lightheadedness, numbness, seizures, speech, difficulty, syncope, tremors, or weakness, headaches, as outlined  previously. Psych: Denies agitation, and behavioral changes, confusional states, decreased concentration, dysphoric moods, or hyperactivity.  Physical exam.  General: Well-developed, well-nourished, no acute distress. HEENT:  Normocephalic, atraumatic. Right and left external ears appear normal. Oropharynx shows some postnasal drainage, but otherwise clear and moist. His nares are edematous, but no active rhinorrhea noted. No oropharyngeal exudate noted. He has some point tenderness over the frontal and maxillary sinuses both bilaterally. Eyes: PERRLA. Conjunctiva normal. EOM. F. No discharge. No scleral icterus. Neck: Full range of motion, supple. CV: Normal rate regular rhythm. Heart sounds are normal. Intact distal pulses. Pulmonary/chest wall: Normal effort. Breath sounds clear to auscultation bilaterally. Abdomen:  Soft. Bowel sounds active. Neuro: Alert, oriented x3. No cranial nerve deficit. No abnormal coordination. No abnormal tone. Skin: Warm, dry, no rash or pallor. Psych: Mood and affect appear normal. Behavior appears normal. Thought content appropriate. Judgment appears normal.  Assessment and plan  1. Allergic sinusitis. Based on his history and symptoms. He is presenting it appears he has an allergic process occurring at this point. However, he has not done similar things that would normally be done initially to help improve this. We instructed him to increase humidification in the home sleep with the head of the bed elevated use normal saline nasal mist frequently in his nares. Use either Zyrtec or Claritin daily for the time being. He may try 600 mg ibuprofen for his pain at this point however, if symptoms do not improve over the next 5 days he should contact the clinic and we can give him a course of Medrol Dosepak to see if this helps with his symptoms. He verbally acknowledged this information and agreed with the plan as stated. He will call in 5 days should he not improve. He  was also instructed to  stay indoors when the pollen count was high to avoid many of the symptoms.HEPA filters in the home were also recommended. If symptoms improve. He is otherwise scheduled to followup with Dr. Ninetta Lights at his next scheduled appointment.

## 2010-08-19 NOTE — Telephone Encounter (Signed)
Call placed to [facility] to check re-enrollment for Topamax for Kenneth [name].  He has been re-enrolled through 08/10/2011, called Kenneth [name] and left a message to let him know.Antionette Fairy

## 2010-09-02 ENCOUNTER — Other Ambulatory Visit: Payer: Self-pay | Admitting: *Deleted

## 2010-09-02 DIAGNOSIS — B20 Human immunodeficiency virus [HIV] disease: Secondary | ICD-10-CM

## 2010-09-02 MED ORDER — EFAVIRENZ-EMTRICITAB-TENOFOVIR 600-200-300 MG PO TABS: 1.0000 | ORAL_TABLET | Freq: Every day | ORAL | Status: DC

## 2010-09-03 ENCOUNTER — Ambulatory Visit (INDEPENDENT_AMBULATORY_CARE_PROVIDER_SITE_OTHER): Payer: Self-pay | Admitting: Adult Health

## 2010-09-03 ENCOUNTER — Other Ambulatory Visit: Payer: Self-pay | Admitting: *Deleted

## 2010-09-03 VITALS — BP 121/76 | HR 85 | Temp 98.6°F | Ht 72.0 in | Wt 192.0 lb

## 2010-09-03 DIAGNOSIS — G44009 Cluster headache syndrome, unspecified, not intractable: Secondary | ICD-10-CM

## 2010-09-03 DIAGNOSIS — B20 Human immunodeficiency virus [HIV] disease: Secondary | ICD-10-CM

## 2010-09-03 MED ORDER — EFAVIRENZ-EMTRICITAB-TENOFOVIR 600-200-300 MG PO TABS: 1.0000 | ORAL_TABLET | Freq: Every day | ORAL | Status: DC

## 2010-09-03 MED ORDER — TOPIRAMATE 25 MG PO TABS: 25.0000 mg | ORAL_TABLET | Freq: Two times a day (BID) | ORAL | Status: DC

## 2010-09-03 NOTE — Progress Notes (Signed)
  Subjective:    Patient ID: Kenneth [name], male    DOB: [birth year], 30 y.o.   MRN: [id number]  HPI consultants skin is tan to presents with complaints of ongoing headaches unresolved with Topamax. Headaches or focal to the right side of the face, and right temporoparietal region of head with associated nausea, and a "pulsation" sensation. Currently taking 25 mg, Topamax by mouth each bedtime for his headaches. Currently states his headaches are occurring daily sometimes twice daily. During the daytime hours and waking him up in the middle of the night.    Review of Systems  Constitutional: Negative.   Eyes: Positive for visual disturbance. Negative for photophobia, pain, discharge, redness and itching.       Some blurring of vision during headache episodes.  Respiratory: Negative.   Cardiovascular: Negative.   Gastrointestinal: Negative.   Genitourinary: Negative.   Musculoskeletal: Negative.   Skin: Negative.   Neurological: Positive for dizziness and headaches. Negative for tremors, seizures, syncope, facial asymmetry, speech difficulty, weakness, light-headedness and numbness.  Hematological: Negative.   Psychiatric/Behavioral: Negative.        Objective:   Physical Exam  Constitutional: He is oriented to person, place, and time. He appears well-developed and well-nourished. No distress.  HENT:  Head: Normocephalic and atraumatic.  Right Ear: External ear normal.  Left Ear: External ear normal.  Nose: Nose normal.  Mouth/Throat: Oropharynx is clear and moist.  Eyes: Conjunctivae and EOM are normal. Pupils are equal, round, and reactive to light.  Neck: Normal range of motion. Neck supple. No thyromegaly present.  Cardiovascular: Normal rate, regular rhythm, normal heart sounds and intact distal pulses.   Pulmonary/Chest: Breath sounds normal.  Abdominal: Soft. Bowel sounds are normal.  Musculoskeletal: Normal range of motion.  Neurological: He is alert and oriented to  person, place, and time. No cranial nerve deficit. He exhibits normal muscle tone. Coordination normal.  Skin: Skin is warm and dry.  Psychiatric: He has a normal mood and affect. His behavior is normal. Judgment and thought content normal.          Assessment & Plan:  1. Cluster Headaches. After reviewing dosing guidelines for Topamax therapy, it is determined that he has reached his maximal responsiveness at the 25 mg dose. It is recommended that his medication be titrated to 25 mg twice a day initially then gradually titrated to 50 mg twice a day. We will start with 25 mg twice a day for now, and asked that he call back to clinic to determine whether or not. He has received response with this regimen. If he has not, we will increase the dosing to 50 mg twice a day and re\re-submit application for patient. Drug assistance. He verbally acknowledged this information and agreed with plan and will call us back next week to determine whether or not. He has received response at the 25 mg twice a day dose. He was also instructed to make certain that he has his labs drawn before his next followup appointment, which he states is scheduled for next month.

## 2010-09-04 ENCOUNTER — Other Ambulatory Visit: Payer: Self-pay | Admitting: *Deleted

## 2010-09-04 NOTE — Telephone Encounter (Signed)
Spoke with pharmacy.  Updated provider information for the pt including change from Dr. Philipp Deputy to Traci Sermon, NP, allergies and medical problems.  Pharmacy will be sending out rx to pt today. Jennet Maduro, RN

## 2010-09-08 NOTE — Op Note (Signed)
NAME:  Kenneth [name], Kenneth [name]              ACCOUNT NO.:  [id number]   MEDICAL RECORD NO.:  [id number]          PATIENT TYPE:  AMB   LOCATION:  DAY                          FACILITY[facility]  [facility]   PHYSICIAN:  Wilmon Arms. Corliss Skains, M.D. DATE OF BIRTH:  10/18/1980   DATE OF PROCEDURE:  03/06/2007  DATE OF DISCHARGE:                               OPERATIVE REPORT   PREOPERATIVE DIAGNOSIS:  Recurrent anal condyloma.   POSTOPERATIVE DIAGNOSIS:  Recurrent anal condyloma.   PROCEDURE PERFORMED:  Excision fulguration of anal condyloma.   SURGEON:  Wilmon Arms. Corliss Skains, M.D., FACS   ANESTHESIA:  General via LMA.   INDICATIONS:  The patient is a 30 year old male who has had two previous  excisions of fulgurations of anal condyloma.  The latest was in March  2008.  I have been following him closely since that time.  He has a  recurrent lesion anteriorly.  We have tried treating this with topical  treatment but without success.  He presents now for excision of this  area.   DESCRIPTION OF PROCEDURE:  The patient was brought to the operating  room, placed in supine position on the operating room table.  After an  adequate level of general anesthesia was obtained, the patient's legs  were placed in lithotomy position with his perineum exposed.  His  perineum was prepped with Betadine and draped in sterile fashion.  A  time-out was taken to assure the proper patient and proper procedure.  His anal canal was dilated up to three fingers with lubricated fingers.  I inserted the silver bullet retractor and we did a careful  circumferential examination of the external perianal skin as well as the  lower rectum.  There was only one lesion noted posteriorly on the  external surface.  This was not clearly condyloma but since it was  suspicious, it was thoroughly cauterized and fulgurated.  The original  anterior midline lesion was once again noted and measured about 2 cm  across.  There were several other small punctate  lesions posteriorly  which could possibly be condyloma.  These were thoroughly fulgurated.  Once we had taken care of all the other lesions, the large anterior  lesion was grasped with forceps and was removed with a scalpel.  Cautery  was used to achieve hemostasis in the base of this lesion.  The mucosa  was reapproximated with 3-0 Vicryl in a running locking fashion.  A  small piece of Gelfoam was placed in the canal.  The retractor was  removed.  Dry dressing was applied.  The patient was moved back to a  supine position and was extubated and brought to the recovery room in  stable condition.  All sponge, instrument and needle counts were  correct.      Wilmon Arms. Tsuei, M.D.  Electronically Signed     MKT/MEDQ  D:  03/06/2007  T:  03/06/2007  Job:  [id number]

## 2010-09-11 NOTE — Op Note (Signed)
NAME:  Kenneth [name], Kenneth [name]              ACCOUNT NO.:  [id number]   MEDICAL RECORD NO.:  [id number]          PATIENT TYPE:  AMB   LOCATION:  DAY                          FACILITY[facility]   PHYSICIAN:  Wilmon Arms. Corliss Skains, M.D. DATE OF BIRTH:  1980/11/19   DATE OF PROCEDURE:  06/27/2006  DATE OF DISCHARGE:                               OPERATIVE REPORT   PREOPERATIVE DIAGNOSIS:  Anal condyloma.   POSTOPERATIVE DIAGNOSIS:  Anal condyloma.   PROCEDURE:  Excision and fulguration of anal condyloma.   SURGEON:  Dr. Corliss Skains.   ANESTHESIA:  General via mask.   INDICATIONS:  The patient is a 30 year old male who is HIV positive who  is status post fulguration of anal condyloma in March 2007.  The patient  has one recurrent external lesion which is fairly large.  He presents  now for removal of this area.   FINDINGS:  1-1/2 cm diameter lesion on the left side which is fully  excised with its base fulgurated.  Internal examination showed several  smaller areas with recurrent condyloma.  These were all thoroughly  fulgurated.   DESCRIPTION OF PROCEDURE:  The patient was brought to the operating  room, placed in a supine position on the operating room table.  He was  given general anesthetic and his legs were placed in yellow fin stirrups  in lithotomy position.  His perineum was prepped with Betadine and  draped in a sterile fashion.  The base of the large external lesion was  infiltrated with 0.25% Marcaine with epinephrine.  He also had one small  external lesion on the left side.  The small lesion was thoroughly  fulgurated with cautery.  The base of the large lesion was amputated  with a scalpel.  This base was then thoroughly cauterized for hemostasis  and to obliterate the condyloma.  A bivalve retractor was inserted and a  thorough circumferential examination of the lower rectum and anal canal  showed several small lesions.  These all measured about 1-2 mm in  diameter.  These were all  thoroughly fulgurated.  He had a total of  about 8 of these.  Once we were satisfied that no external or internal  lesions remained, the anal canal was packed with Gelfoam, smeared with  dibucaine ointment.  His legs were brought down to supine position.  The  patient was awakened, brought to recovery in stable condition.  All  sponge, instrument, and needle counts were correct.      Wilmon Arms. Tsuei, M.D.  Electronically Signed     MKT/MEDQ  D:  06/27/2006  T:  06/27/2006  Job:  [id number]

## 2010-09-11 NOTE — Assessment & Plan Note (Signed)
[facility]                         GASTROENTEROLOGY OFFICE NOTE   NAME:Kenneth [name]                     MRN:          [id number]  DATE:04/29/2006                            DOB:          18-Jun-1980    REASON FOR REFERRAL:  Dr. Philipp Deputy asked me to evaluate Kenneth [name] in  consultation regarding anal warts.   HISTORY OF PRESENT ILLNESS:  Kenneth [name] is a very pleasant 30 year old  HIV positive man who has trouble with anal warts for at least a year or  two now.  He had one treatment with laser removal in [place]  approximately 1-2 years ago.  Things were better for some time, although  he thought there was still some remaining condyloma tissue.  This has  grown and now is quite sizeable.  He says it periodically will ooze a  small amount of blood on tissue paper, otherwise he feels very well.   REVIEW OF SYSTEMS:  Is essentially negative and is available on his  nursing intake sheet.   PAST MEDICAL HISTORY:  1. HIV positive, CD4 count under good control on Atripla currently.  2. Depression.  3. History of anal warts.   CURRENT MEDICATIONS:  Atripla, loratadine, Ambien, Wellbutrin.   ALLERGIES:  NO KNOWN DRUG ALLERGIES.   SOCIAL HISTORY:  Single, lives by himself. Smokes half pack a day, non  drinker.  Has anal sex with men.   FAMILY HISTORY:  No colon cancer, colon polyps in family.   PHYSICAL EXAMINATION:  He is 5 feet 11 inches, 206 pounds, blood  pressure 134/92, pulse 70.  CONSTITUTIONAL:  Generally well-appearing.  NEUROLOGIC:  Alert and oriented x3.  EYES:  Extraocular muscles are intact.  MOUTH:  Oropharynx moist, no lesions.  NECK:  Supple, no lymphadenopathy.  CARDIOVASCULAR:  Regular rate and rhythm.  LUNGS:  Clear to auscultation bilaterally.  ABDOMEN:  Soft, nontender, nondistended, normal bowel sounds.  EXTREMITIES:  No extremity edema.  SKIN:  No rash or lesions on visible extremities.  ANAL/RECTAL:  A 2-cm anal  condyloma on somewhat of a stalk, protruding  externally.  Anal exam otherwise normal.   ASSESSMENT/PLAN:  A 30 year old man with anal condyloma, setting of  human immunodeficiency virus.   He has an obvious anal condyloma present in his anus, it is  approximately 2-cm.  We are not set up here in our clinic to treat  these, there is a variety of ways to treat them including laser removal,  surgical excision, silver nitrate.  I myself do not have much  experience, and I think it is best that he is seen by a dermatologist  who probably has more experience with this issue.  They may decide that  the 2-cm size is a bit large and refer him to a general surgeon for  excision.     Rachael Fee, MD  Electronically Signed    DPJ/MedQ  DD: 04/29/2006  DT: 04/29/2006  Job #: [id number]   cc:   Tresa Endo L. Philipp Deputy, M.D.

## 2010-09-14 ENCOUNTER — Other Ambulatory Visit: Payer: Self-pay | Admitting: *Deleted

## 2010-09-14 DIAGNOSIS — G44009 Cluster headache syndrome, unspecified, not intractable: Secondary | ICD-10-CM

## 2010-09-14 MED ORDER — TOPIRAMATE 25 MG PO TABS: 50.0000 mg | ORAL_TABLET | Freq: Two times a day (BID) | ORAL | Status: DC

## 2010-09-14 NOTE — Telephone Encounter (Signed)
Pt called to report that 50mg  of topamax twice a day has helped his headaches. Spoke with NP. He gave order to change his med to 50mg  bid. I sent the refill to [facility] & [address]

## 2010-09-16 ENCOUNTER — Other Ambulatory Visit: Payer: Self-pay | Admitting: *Deleted

## 2010-09-16 DIAGNOSIS — G44009 Cluster headache syndrome, unspecified, not intractable: Secondary | ICD-10-CM

## 2010-09-16 MED ORDER — TOPIRAMATE 25 MG PO TABS: 50.0000 mg | ORAL_TABLET | Freq: Two times a day (BID) | ORAL | Status: DC

## 2010-11-06 ENCOUNTER — Encounter: Payer: Self-pay | Admitting: Adult Health

## 2010-11-06 ENCOUNTER — Ambulatory Visit: Payer: Self-pay | Admitting: Adult Health

## 2010-11-06 ENCOUNTER — Encounter: Payer: Self-pay | Admitting: *Deleted

## 2010-11-06 ENCOUNTER — Telehealth: Payer: Self-pay | Admitting: *Deleted

## 2010-11-06 ENCOUNTER — Ambulatory Visit (INDEPENDENT_AMBULATORY_CARE_PROVIDER_SITE_OTHER): Payer: Self-pay | Admitting: Adult Health

## 2010-11-06 DIAGNOSIS — B20 Human immunodeficiency virus [HIV] disease: Secondary | ICD-10-CM

## 2010-11-06 DIAGNOSIS — Z79899 Other long term (current) drug therapy: Secondary | ICD-10-CM

## 2010-11-06 DIAGNOSIS — I10 Essential (primary) hypertension: Secondary | ICD-10-CM

## 2010-11-06 DIAGNOSIS — Z113 Encounter for screening for infections with a predominantly sexual mode of transmission: Secondary | ICD-10-CM

## 2010-11-06 DIAGNOSIS — R51 Headache: Secondary | ICD-10-CM

## 2010-11-06 MED ORDER — BUTALBITAL-ASA-CAFFEINE 50-325-40 MG PO CAPS: 1.0000 | ORAL_CAPSULE | ORAL | Status: AC | PRN

## 2010-11-06 NOTE — Progress Notes (Signed)
Subjective:    Patient ID: Kenneth [name], male    DOB: [birth year], 30 y.o.   MRN: [id number]  HPI Kenneth [name] presents to clinic with a one-week history of tension-like headaches that are different than cluster headaches for which he has a history. States his pain. His head is 5 slight around frontal and bitemporal areas and in the past 24-hours he has also had vomiting. He also relates a very high stress work environment very recently and during this same period of time, he reports his stools have also been loose and watery. He denies any fevers, chills, or sweats. Denies hematemesis clay-colored stools or dark, tea-colored urine. States most times when he is nauseous. He is also having one of these headaches.   Review of Systems  Constitutional: Negative for fever, chills, diaphoresis, fatigue and unexpected weight change.  HENT: Negative for hearing loss, ear pain, nosebleeds, congestion, sore throat, rhinorrhea, sneezing, drooling, mouth sores, trouble swallowing, neck pain, neck stiffness, dental problem, voice change, postnasal drip, sinus pressure, tinnitus and ear discharge.   Eyes: Negative.   Respiratory: Negative.   Cardiovascular: Negative for chest pain, palpitations and leg swelling.  Gastrointestinal: Positive for nausea, vomiting and diarrhea. Negative for abdominal pain, constipation, blood in stool, abdominal distention and anal bleeding.  Genitourinary: Negative.   Musculoskeletal: Negative.   Neurological: Negative for dizziness, seizures, syncope, facial asymmetry, speech difficulty, weakness, light-headedness and headaches.  Hematological: Negative.   Psychiatric/Behavioral: Negative for suicidal ideas, hallucinations, behavioral problems, confusion, sleep disturbance, self-injury, dysphoric mood, decreased concentration and agitation. The patient is not nervous/anxious and is not hyperactive.        Objective:   Physical Exam  Constitutional: He is oriented to  person, place, and time. He appears well-developed and well-nourished. No distress.  HENT:  Head: Normocephalic and atraumatic.  Right Ear: External ear normal.  Left Ear: External ear normal.  Nose: Nose normal.  Mouth/Throat: Oropharynx is clear and moist.  Eyes: Conjunctivae and EOM are normal.  Neck: Normal range of motion. Neck supple.  Cardiovascular: Normal rate and regular rhythm.   Pulmonary/Chest: Effort normal and breath sounds normal.  Abdominal: Soft. Bowel sounds are normal. He exhibits no distension and no mass. There is no tenderness. There is no rebound and no guarding.  Musculoskeletal: Normal range of motion.  Neurological: He is alert and oriented to person, place, and time. No cranial nerve deficit. He exhibits normal muscle tone. Coordination normal.  Skin: Skin is warm and dry.  Psychiatric: He has a normal mood and affect. His behavior is normal. Judgment and thought content normal.          Assessment & Plan:  1. Tension Headaches. Presentation is described different than his cluster headaches which he has had in the past. We will try butalbital/ASA/caffeine one by mouth every 4 hours when necessary and monitor response.  2. Nausea, Vomiting, Loose Stools. While it is felt that treatment of tension headaches. Will improve many of these symptoms, we recommend a clear diet, progress to a BRAT diet to a bland, low-fat diet, lactose-free. We will monitor this if symptoms do not improve.  3. HIV. Has not had staging labs performed since January 2012. Because this is Friday, obtaining. CD4 count would not be possible. Therefore, we will ask that he return for his next week to have staging labs drawn and have him followup with [place] in 2 weeks. We will also obtain lipid panel, urinalysis, urine, GC/Chlamydia, RPR.  He verbally acknowledged all  information was provided to him and agreed with plan of care.

## 2010-11-06 NOTE — Telephone Encounter (Signed)
C/o HA, N&V since this am & wants to be seen. Placed with our NP this am

## 2010-11-06 NOTE — Patient Instructions (Addendum)
Headache, General, Without Cause A general headache has no specific cause. Things that start general headaches include:  Stress.   Depression.  These headaches are not life-threatening. They will not lead to other types of headaches.  HOME CARE  Make and keep follow-up appointments.   Only take medicine as told by your doctor.   Helpful ways to relax are:   Using thoughts to control the body (biofeedback).   Massage.   Use cold and heat treatment. Apply the cold or heat to the head and neck. Apply 3 or 4 times a day, or as needed.  FINDING OUT THE RESULTS OF YOUR TEST Ask your doctor when your test results will be ready. Make sure you follow up and get the test results. GET HELP IF:  You or your child has problems with the medicine that was prescribed.   The medicine is not helping relieve the pain.   The headache changes.   You or your child feels sick to his or her stomach (nauseous) or throws up (vomits).  GET HELP RIGHT AWAY IF:  The headache becomes very bad.   You or your child has a temperature by mouth above 101, not controlled by medicine.   The neck is stiff.   You or your child has trouble seeing.   The muscles are weak.   You or your child loses control of the muscles.   You or your child has very bad symptoms different from the first symptoms.   You or your child loses balance or has trouble walking.   You or your child feels like he or she is going to pass out (faint).  MAKE SURE YOU:  Understand these instructions.   Will watch this condition.   Will get help right away if you or your child is not doing well or gets worse.  Document Released: 01/20/2008 Document Re-Released: 07/07/2009 [facility] Patient Information 2011 [place], [place].Clear Liquid Diet The Clear Liquid Diet (CLD) is restricted to:  Those foods that are liquid or will become liquid at body temperature.   Liquids that leave little or no residue and that may be absorbed  easily with a minimum of digestive activity.  Because the Clear Liquid Diet is so restrictive (see Adequacy below), it does not meet the Recommended Dietary Allowances of the [employer]. The CLD provides some electrolytes (salts in the blood), mainly sodium and potassium, and a limited number of calories. Kilocalories are primarily from carbohydrates, even though the recommended foods provide minimal amounts of protein and fat. A high protein, low residue, or no residue supplement should be used to meet an individual's nutritional requirements when the CLD is continued for more than 2 to 3 days. In a malnourished patient, supplements should be started right away. A Surgical Clear Liquid Diet is similar to the CLD. The exceptions are the avoidance of any fruit juices and any red gelatin, fruit ice, etc. Low residue supplements are also omitted. INDICATIONS FOR USE  In acute (sudden) conditions for the presurgical and/or postsurgical patient.   As the first step in oral feeding.   For fluid and electrolyte replacement in diarrheal diseases.   As a test diet.  ADEQUACY The CLD is adequate only in ascorbic acid, according to the Recommended Dietary Allowances of the [employer]. SPECIAL NOTES This diet is very restrictive. It provides some electrolytes and a small amount of calories. Use should be limited to very short periods, and only under the advice or supervision  of your caregiver or dietitian.  Food Group: Breads/Starches.   Allowed/Recommended: None are allowed.   Avoid/Use Sparingly: All are avoided.   Food Group: Vegetables.   Allowed/Recommended: Strained tomato or vegetable juice.   Avoid/Use Sparingly: Any others.   Food Group: Fruit.   Allowed/Recommended: Strained fruit juices and fruit drinks. Include 1 serving of citrus, or include 1 serving of citrus or vitamin C-enriched fruit juice daily.   Avoid/Use Sparingly: Any others.   Food Group:  Warehouse manager.   Allowed/Recommended: None are allowed.   Avoid/Use Sparingly: All are avoided.   Food Group: Milk.   Allowed/Recommended: None are allowed.   Avoid/Use Sparingly: All are avoided.   Food Group: Soups and Combination Foods.   Allowed/Recommended: Clear bouillon, broth, or strained, broth-based soups.   Avoid/Use Sparingly: Any others.   Food Group: Desserts and Sweets.   Allowed/Recommended: Flavored gelatin, ices, or frozen ice pops that do not contain milk. Sugar, honey. High protein gelatin.   Avoid/Use Sparingly: Any others.   Food Group: Fats and Oils.   Allowed/Recommended: None are allowed.   Avoid/Use Sparingly: All are avoided.   Food Group: Beverages.   Allowed/Recommended: Carbonated beverages, cereal beverages, coffee (regular or decaffeinated), tea.   Avoid/Use Sparingly: Any others.   Food Group: Condiments.   Allowed/Recommended: Iodized salt.   Avoid/Use Sparingly: Any others, including pepper.   Food Group: Supplements.   Allowed/Recommended: Liquid nutrition beverages.   Avoid/Use Sparingly: Any others that contain lactose (a sugar in dairy products), residue, or are not palatable orally.  * Use of supplements on a Clear Liquid Diet is controversial. Policies for their use are decided by each institution or according to individual circumstances. A sample meal plan for the CLD is available from your dietitian. SAMPLE MEAL PLAN* Breakfast  4 oz. strained orange juice. 1/2-1 cup gelatin (plain or fortified). 1 cup beverage (coffee or tea). Sugar if desired.  Midmorning Snack  1/2 cup gelatin (plain or fortified).   Noon Meal  1 cup broth or consomm. 4 oz. strained grapefruit juice. 1/2 cup gelatin (plain or fortified). 1 cup beverage (coffee or tea). Sugar if desired.  Midafternoon Snack  1/2 cup fruit ice and/or 1/2 cup strained fruit juice.   Evening Meal  1 cup broth or consomm. 1/2 cup cranberry  juice. 1/2 cup flavored gelatin (plain or fortified). 1 cup beverage (coffee or tea). Sugar if desired.  Evening Snack  4 oz. strained apple juice (vitamin C-fortified). 1/2 cup flavored gelatin (plain or fortified).   * The above sample meal plan cannot meet the Recommended Dietary Allowances of the [employer], except for vitamin C. Document Released: 04/12/2005 Document Re-Released: 07/07/2009 [facility] Patient Information 2011 [place], [place].Diet for Diarrhea, Adult Having frequent, runny stools (diarrhea) has many causes. Diarrhea may be caused or worsened by food or drink. Diarrhea may be relieved by changing your diet. IF YOU ARE NOT TOLERATING SOLID FOODS:  Drink enough water and fluids to keep your urine clear or pale yellow.   Avoid sugary drinks and sodas as well as milk-based beverages.   Avoid beverages containing caffeine and alcohol.   You may try rehydrating beverages. You can make your own by following this recipe:   1/2 teaspoon table salt.   3/4 teaspoon baking soda.   1/3 teaspoon salt substitute (potassium chloride).   1 tablespoon + 1 teaspoon sugar.   1 quart of water.  As your stools become more solid, you can start eating solid  foods. Add foods one at a time. If a certain food causes your diarrhea to get worse, avoid that food and try other foods. A low-fiber, low-fat, and lactose-free diet is recommended. Small, frequent meals may be better tolerated.   FOOD GROUP: STARCHES ALLOWED/RECOMMENDED White, [place], and pita breads, plain rolls, buns, bagels. Plain muffins, matzo. Soda, saltine, or graham crackers. Pretzels, melba toast, zwieback. Cooked cereals made with water: cornmeal, farina, cream cereals. Dry cereals: refined corn, wheat, rice. Potatoes prepared any way without skins, refined macaroni, spaghetti, noodles, refined rice. AVOID/USE SPARINGLY Bread, rolls, or crackers made with whole wheat, multi-grains, rye, bran seeds, nuts, or  coconut. Corn tortillas or taco shells. Cereals containing whole grains, multi-grains, bran, coconut, nuts, or raisins. Cooked or dry oatmeal. Coarse wheat cereals, granola. Cereals advertised as "high-fiber." Potato skins. Whole grain pasta, wild or brown rice. Popcorn. Sweet potatoes/yams. Sweet rolls, doughnuts, waffles, pancakes, sweet breads. FOOD GROUP: VEGETABLES ALLOWED/RECOMMENDED Strained tomato and vegetable juices. Most well-cooked and canned vegetables without seeds. Fresh: Tender lettuce, cucumber without the skin, cabbage, spinach, bean sprouts. AVOID/USE SPARINGLY Fresh, cooked, or canned: Artichokes, baked beans, beet greens, broccoli, Brussels sprouts, corn, kale, legumes, peas, sweet potatoes. Cooked: Green or red cabbage, spinach. Avoid large servings of any vegetables, because vegetables shrink when cooked, and they contain more fiber per serving than fresh vegetables. FOOD GROUP: FRUIT ALLOWED/RECOMMENDED All fruit juices except prune juice. Cooked or canned: Apricots, applesauce, cantaloupe, cherries, fruit cocktail, grapefruit, grapes, kiwi, mandarin oranges, peaches, pears, plums, watermelon. Fresh: Apples without skin, ripe banana, grapes, cantaloupe, cherries, grapefruit, peaches, oranges, plums. Keep servings limited to 1/2 cup or 1 piece. AVOID/USE SPARINGLY Fresh: Apple with skin, apricots, mango, pears, raspberries, strawberries. Prune juice, stewed or dried prunes. Dried fruits, raisins, dates. Large servings of all fresh fruits. FOOD GROUP: MEAT AND MEAT SUBSTITUTES ALLOWED/RECOMMENDED Ground or well-cooked tender beef, ham, veal, lamb, pork, or poultry. Eggs, plain cheese. Fish, oysters, shrimp, lobster, other seafoods. Liver, organ meats. AVOID/USE SPARINGLY Tough, fibrous meats with gristle. Peanut butter, smooth or chunky. Cheese, nuts, seeds, legumes, dried peas, beans, lentils. FOOD GROUP: MILK ALLOWED/RECOMMENDED Yogurt, lactose-free milk, kefir, drinkable  yogurt, buttermilk, soy milk. AVOID/USE SPARINGLY Milk, chocolate milk, beverages made with milk, such as milk shakes. FOOD GROUP: SOUPS ALLOWED/RECOMMENDED Bouillon, broth, or soups made from allowed foods. Any strained soup. AVOID/USE SPARINGLY Soups made from vegetables that are not allowed, cream or milk-based soups. FOOD GROUP: DESSERTS AND SWEETS ALLOWED/RECOMMENDED Sugar-free gelatin, sugar-free frozen ice pops made without sugar alcohol. AVOID/USE SPARINGLY Plain cakes and cookies, pie made with allowed fruit, pudding, custard, cream pie. Gelatin, fruit, ice, sherbet, frozen ice pops. Ice cream, ice milk without nuts. Plain hard candy, honey, jelly, molasses, syrup, sugar, chocolate syrup, gumdrops, marshmallows. FOOD GROUP: FATS AND OILS ALLOWED/RECOMMENDED Avoid any fats and oils. AVOID/USE SPARINGLY Seeds, nuts, olives, avocados. Margarine, butter, cream, mayonnaise, salad oils, plain salad dressings made from allowed foods. Plain gravy, crisp bacon without rind. FOOD GROUP: BEVERAGES ALLOWED/RECOMMENDED Water, decaffeinated teas, oral rehydration solutions, sugar-free beverages. AVOID/USE SPARINGLY Fruit juices, caffeinated beverages (coffee, tea, soda or pop), alcohol, sports drinks, or lemon-lime soda or pop. FOOD GROUP: CONDIMENTS/MISCELLANEOUS ALLOWED/RECOMMENDED Ketchup, mustard, horseradish, vinegar, cream sauce, cheese sauce, cocoa powder. Spices in moderation: allspice, basil, bay leaves, celery powder or leaves, cinnamon, cumin powder, curry powder, ginger, mace, marjoram, onion or garlic powder, oregano, paprika, parsley flakes, ground pepper, rosemary, sage, savory, tarragon, thyme, turmeric. AVOID/USE SPARINGLY Coconut, honey.  Weight Monitoring: Weigh yourself every day. You should weigh  yourself in the morning after you urinate and before you eat breakfast. Wear the same amount of clothing when you weigh yourself. Record your weight daily. Bring your recorded  weights to your clinic visits. Tell your caregiver right away if you have gained  or more pounds in 1 day,  pounds in a week or whatever amount you were told to report.  SEEK IMMEDIATE MEDICAL CARE IF:  You are unable to keep fluids down.   You start to throw up (vomit) or diarrhea keeps coming back (persistent).   Belly (abdominal) pain develops, increases, or can be felt in one place (localizes).   You have an oral temperature above 101, not controlled by medicine.   Diarrhea contains blood or mucus.   You develop excessive weakness, dizziness, fainting, or extreme thirst.  MAKE SURE YOU:    Understand these instructions.   Will watch your condition.   Will get help right away if you are not doing well or get worse.  Document Released: 07/03/2003 Document Re-Released: 07/07/2009 [facility] Patient Information 2011 [place], [place].

## 2010-11-20 DIAGNOSIS — Z113 Encounter for screening for infections with a predominantly sexual mode of transmission: Secondary | ICD-10-CM

## 2010-11-20 DIAGNOSIS — B20 Human immunodeficiency virus [HIV] disease: Secondary | ICD-10-CM

## 2010-11-20 DIAGNOSIS — Z79899 Other long term (current) drug therapy: Secondary | ICD-10-CM

## 2010-11-23 ENCOUNTER — Other Ambulatory Visit (INDEPENDENT_AMBULATORY_CARE_PROVIDER_SITE_OTHER): Payer: Self-pay

## 2010-11-23 ENCOUNTER — Other Ambulatory Visit: Payer: Self-pay | Admitting: Infectious Diseases

## 2010-11-23 DIAGNOSIS — B20 Human immunodeficiency virus [HIV] disease: Secondary | ICD-10-CM

## 2010-11-23 DIAGNOSIS — Z113 Encounter for screening for infections with a predominantly sexual mode of transmission: Secondary | ICD-10-CM

## 2010-11-23 DIAGNOSIS — Z79899 Other long term (current) drug therapy: Secondary | ICD-10-CM

## 2010-11-23 LAB — CBC WITH DIFFERENTIAL/PLATELET
Basophils Absolute: 0 10*3/uL (ref 0.0–0.1)
Basophils Relative: 0 % (ref 0–1)
Hemoglobin: 13.1 g/dL (ref 13.0–17.0)
MCHC: 32.6 g/dL (ref 30.0–36.0)
Neutro Abs: 4.1 10*3/uL (ref 1.7–7.7)
Neutrophils Relative %: 60 % (ref 43–77)
Platelets: 262 10*3/uL (ref 150–400)
RDW: 13.9 % (ref 11.5–15.5)

## 2010-11-23 LAB — RPR

## 2010-11-23 LAB — LIPID PANEL: Cholesterol: 157 mg/dL (ref 0–200)

## 2010-11-24 LAB — COMPLETE METABOLIC PANEL WITH GFR
Albumin: 4.3 g/dL (ref 3.5–5.2)
CO2: 29 mEq/L (ref 19–32)
GFR, Est African American: >60 mL/min (ref >60)
GFR, Est Non African American: >60 mL/min (ref >60)
Glucose, Bld: 93 mg/dL (ref 70–99)
Sodium: 143 mEq/L (ref 135–145)
Total Bilirubin: 0.3 mg/dL (ref 0.3–1.2)
Total Protein: 7.3 g/dL (ref 6.0–8.3)

## 2010-11-24 LAB — T-HELPER CELL (CD4) - (RCID CLINIC ONLY): CD4 T Cell Abs: 1000 uL (ref 400–2700)

## 2010-12-07 ENCOUNTER — Ambulatory Visit: Payer: Self-pay | Admitting: Adult Health

## 2010-12-07 ENCOUNTER — Ambulatory Visit: Payer: Self-pay

## 2010-12-07 ENCOUNTER — Ambulatory Visit (INDEPENDENT_AMBULATORY_CARE_PROVIDER_SITE_OTHER): Payer: Self-pay | Admitting: Adult Health

## 2010-12-07 ENCOUNTER — Encounter: Payer: Self-pay | Admitting: Adult Health

## 2010-12-07 DIAGNOSIS — B20 Human immunodeficiency virus [HIV] disease: Secondary | ICD-10-CM

## 2010-12-07 DIAGNOSIS — Z87891 Personal history of nicotine dependence: Secondary | ICD-10-CM

## 2010-12-07 NOTE — Patient Instructions (Signed)
Smoking Cessation This document explains the best ways for you to quit smoking and new treatments to help. It lists new medicines that can double or triple your chances of quitting and quitting for good. It also considers ways to avoid relapses and concerns you may have about quitting, including weight gain. NICOTINE: A POWERFUL ADDICTION If you have tried to quit smoking, you know how hard it can be. It is hard because nicotine is a very addictive drug. For some people, it can be as addictive as heroin or cocaine. Usually, people make 2 or 3 tries, or more, before finally being able to quit. Each time you try to quit, you can learn about what helps and what hurts. Quitting takes hard work and a lot of effort, but you can quit smoking. QUITTING SMOKING IS ONE OF THE MOST IMPORTANT THINGS YOU WILL EVER DO:  You will live longer, feel better, and live better.   The impact on your body of quitting smoking is felt almost immediately:   Within 20 minutes, blood pressure decreases. Pulse returns to its normal level.   After 8 hours, carbon monoxide levels in the blood return to normal. Oxygen level increases.   After 24 hours, chance of heart attack starts to decrease. Breath, hair, and body stop smelling like smoke.   After 48 hours, damaged nerve endings begin to recover. Sense of taste and smell improve.   After 72 hours, the body is virtually free of nicotine. Bronchial tubes relax and breathing becomes easier.   After 2 to 12 weeks, lungs can hold more air. Exercise becomes easier and circulation improves.   Quitting will lower your chance of having a heart attack, stroke, cancer, or lung disease:   After 1 year, the risk of coronary heart disease is cut in half.   After 5 years, the risk of stroke falls to the same as a nonsmoker.   After 10 years, the risk of lung cancer is cut in half and the risk of other cancers decreases significantly.   After 15 years, the risk of coronary heart  disease drops, usually to the level of a nonsmoker.   If you are pregnant, quitting smoking will improve your chances of having a healthy baby.   The people you live with, especially your children, will be healthier.   You will have extra money to spend on things other than cigarettes.  FIVE KEYS TO QUITTING Studies have shown that these 5 steps will help you quit smoking and quit for good. You have the best chances of quitting if you use them together: 1. Get ready.  2. Get support and encouragement.  3. Learn new skills and behaviors.  4. Get medicine to reduce your nicotine addiction and use it correctly.  5. Be prepared for relapse or difficult situations. Be determined to continue trying to quit, even if you do not succeed at first.  1. GET READY  Set a quit date.   Change your environment.   Get rid of ALL cigarettes, ashtrays, matches, and lighters in your home, car, and place of work.   Do not let people smoke in your home.   Review your past attempts to quit. Think about what worked and what did not.   Once you quit, do not smoke. NOT EVEN A PUFF!  2. GET SUPPORT AND ENCOURAGEMENT Studies have shown that you have a better chance of being successful if you have help. You can get support in many ways.  Tell  your family, friends, and coworkers that you are going to quit and need their support. Ask them not to smoke around you.   Talk to your caregivers (doctor, dentist, nurse, pharmacist, psychologist, and/or smoking counselor).   Get individual, group, or telephone counseling and support. The more counseling you have, the better your chances are of quitting. Programs are available at local hospitals and health centers. Call your local health department for information about programs in your area.   Spiritual beliefs and practices may help some smokers quit.   Quit meters are small computer programs online or downloadable that keep track of quit statistics, such as amount  of "quit-time," cigarettes not smoked, and money saved.   Many smokers find one or more of the many self-help books available useful in helping them quit and stay off tobacco.  3. LEARN NEW SKILLS AND BEHAVIORS  Try to distract yourself from urges to smoke. Talk to someone, go for a walk, or occupy your time with a task.   When you first try to quit, change your routine. Take a different route to work. Drink tea instead of coffee. Eat breakfast in a different place.   Do something to reduce your stress. Take a hot bath, exercise, or read a book.   Plan something enjoyable to do every day. Reward yourself for not smoking.   Explore interactive web-based programs that specialize in helping you quit.  4. GET MEDICINE AND USE IT CORRECTLY Medicines can help you stop smoking and decrease the urge to smoke. Combining medicine with the above behavioral methods and support can quadruple your chances of successfully quitting smoking. The [place]. Food and Drug Administration (FDA) has approved 7 medicines to help you quit smoking. These medicines fall into 3 categories.  Nicotine replacement therapy (delivers nicotine to your body without the negative effects and risks of smoking):   Nicotine gum: Available over-the-counter.   Nicotine lozenges: Available over-the-counter.   Nicotine inhaler: Available by prescription.   Nicotine nasal spray: Available by prescription.   Nicotine skin patches (transdermal): Available by prescription and over-the-counter.   Antidepressant medicine (helps people abstain from smoking, but how this works is unknown):   Bupropion sustained-release (SR) tablets: Available by prescription.   Nicotinic receptor partial agonist (simulates the effect of nicotine in your brain):   Varenicline tartrate tablets: Available by prescription.   Ask your caregiver for advice about which medicines to use and how to use them. Carefully read the information on the package.    Everyone who is trying to quit may benefit from using a medicine. If you are pregnant or trying to become pregnant, nursing an infant, you are under age 18, or you smoke fewer than 10 cigarettes per day, talk to your caregiver before taking any nicotine replacement medicines.   You should stop using a nicotine replacement product and call your caregiver if you experience nausea, dizziness, weakness, vomiting, fast or irregular heartbeat, mouth problems with the lozenge or gum, or redness or swelling of the skin around the patch that does not go away.   Do not use any other product containing nicotine while using a nicotine replacement product.   Talk to your caregiver before using these products if you have diabetes, heart disease, asthma, stomach ulcers, you had a recent heart attack, you have high blood pressure that is not controlled with medicine, a history of irregular heartbeat, or you have been prescribed medicine to help you quit smoking.  5. BE PREPARED FOR RELAPSE OR   DIFFICULT SITUATIONS  Most relapses occur within the first 3 months after quitting. Do not be discouraged if you start smoking again. Remember, most people try several times before they finally quit.   You may have symptoms of withdrawal because your body is used to nicotine. You may crave cigarettes, be irritable, feel very hungry, cough often, get headaches, or have difficulty concentrating.   The withdrawal symptoms are only temporary. They are strongest when you first quit, but they will go away within 10 to 14 days.  Here are some difficult situations to watch for:  Alcohol. Avoid drinking alcohol. Drinking lowers your chances of successfully quitting.   Caffeine. Try to reduce the amount of caffeine you consume. It also lowers your chances of successfully quitting.   Other smokers. Being around smoking can make you want to smoke. Avoid smokers.   Weight gain. Many smokers will gain weight when they quit, usually  less than 10 pounds. Eat a healthy diet and stay active. Do not let weight gain distract you from your main goal, quitting smoking. Some medicines that help you quit smoking may also help delay weight gain. You can always lose the weight gained after you quit.   Bad mood or depression. There are a lot of ways to improve your mood other than smoking.  If you are having problems with any of these situations, talk to your caregiver. SPECIAL SITUATIONS OR CONDITIONS Studies suggest that everyone can quit smoking. Your situation or condition can give you a special reason to quit.  Pregnant women/New mothers: By quitting, you protect your baby's health and your own.   Hospitalized patients: By quitting, you reduce health problems and help healing.   Heart attack patients: By quitting, you reduce your risk of a second heart attack.   Lung, head, and neck cancer patients: By quitting, you reduce your chance of a second cancer.   Parents of children and adolescents: By quitting, you protect your children from illnesses caused by secondhand smoke.  QUESTIONS TO THINK ABOUT Think about the following questions before you try to stop smoking. You may want to talk about your answers with your caregiver.  Why do you want to quit?   If you tried to quit in the past, what helped and what did not?   What will be the most difficult situations for you after you quit? How will you plan to handle them?   Who can help you through the tough times? Your family? Friends? Caregiver?   What pleasures do you get from smoking? What ways can you still get pleasure if you quit?  Here are some questions to ask your caregiver:  How can you help me to be successful at quitting?   What medicine do you think would be best for me and how should I take it?   What should I do if I need more help?   What is smoking withdrawal like? How can I get information on withdrawal?  Quitting takes hard work and a lot of effort,  but you can quit smoking. FOR MORE INFORMATION Smokefree.gov (http://www.davis-sullivan.com/) provides free, accurate, evidence-based information and professional assistance to help support the immediate and long-term needs of people trying to quit smoking. Document Released: 04/06/2001 Document Re-Released: 09/30/2009 [facility] Patient Information 2011 [place], [place].  Smoking Cessation, Tips for Success YOU CAN QUIT SMOKING If you are ready to quit smoking, congratulations! You have chosen to help yourself be healthier. Cigarettes bring nicotine, tar, carbon monoxide, and other irritants  into your body. Your lungs, heart, and blood vessels will be able to work better without these poisons. There are lots of different ways to quit smoking. Nicotine gum, nicotine patches, a nicotine inhaler, or nicotine nasal spray help with physical craving. Hypnosis, support groups, and medicines help break the habit of smoking. Here are some tips to help you quit for good. 6. Throw away all cigarettes.  7. Clean and remove all ashtrays from your home, work, and car.  8. On a card, write down your reasons for quitting. Carry the card with you and read it when you get the urge to smoke.  9. Cleanse your body of nicotine. Drink enough water and fluids to keep your urine clear or pale yellow. Do this after quitting to flush the nicotine from your body.  10. Learn to predict your moods. Do not let a bad situation be your excuse to have a cigarette. Some situations in your life might tempt you into wanting a cigarette.  11. Never have "just one" cigarette. It leads to wanting another and another. Remind yourself of your decision to quit.  12. Change habits associated with smoking. If you smoked while driving or when feeling stressed, try other activities to replace smoking. Stand up when drinking your coffee. Brush your teeth after eating. Sit in a different chair when you read the paper. Avoid alcohol while trying to quit,  and try to drink fewer caffeinated beverages. Alcohol and caffeine may urge you to smoke.  13. Avoid foods and drinks that can trigger a desire to smoke, such as sugary or spicy foods and alcohol.  14. Ask people who smoke not to smoke around you.  15. Have something planned to do right after eating or having a cup of coffee. Take a walk or exercise to perk you up. This will help to keep you from overeating.  16. Try a relaxation exercise to calm you down and decrease your stress. Remember, you may be tense and nervous in the first 2 weeks after you quit, but this will pass.  17. Find new activities to keep your hands busy. Play with a pen, coin, or rubber band. Doodle or draw things on paper.  18. Brush your teeth right after eating. This will help cut down on the craving for the taste of tobacco after meals. You can try mouthwash, too.  19. Use oral substitutes, such as lemon drops, carrots, a cinnamon stick, or chewing gum, in place of cigarettes. Keep them handy so they are available when you have the urge to smoke.  20. When you have the urge to smoke, try deep breathing.  21. Designate your home as a nonsmoking area.  22. If you are a heavy smoker, ask your caregiver about a prescription for nicotine chewing gum. It can ease your withdrawal from nicotine.  23. Reward yourself. Set aside the cigarette money you save and buy yourself something nice.  24. Look for support from others. Join a support group or smoking cessation program. Ask someone at home or at work to help you with your plan to quit smoking.  25. Always ask yourself, "Do I need this cigarette or is this just a reflex?" Tell yourself, "Today, I choose not to smoke," or "I do not want to smoke." You are reminding yourself of your decision to quit, even if you do smoke a cigarette.  HOW WILL I FEEL WHEN I QUIT SMOKING?  The benefits of not smoking start within days of quitting.  You may have symptoms of withdrawal because your  body is used to nicotine (the addictive substance in cigarettes). You may crave cigarettes, be irritable, feel very hungry, cough often, get headaches, or have difficulty concentrating.   The withdrawal symptoms are only temporary. They are strongest when you first quit but will go away within 10 to 14 days.   When withdrawal symptoms occur, stay in control. Think about your reasons for quitting. Remind yourself that these are signs that your body is healing and getting used to being without cigarettes.   Remember that withdrawal symptoms are easier to treat than the major diseases that smoking can cause.   Even after the withdrawal is over, expect periodic urges to smoke. However, these cravings are generally short-lived and will go away whether you smoke or not. Do not smoke!   If you relapse and smoke again, do not lose hope. Of the people who quit, 75% relapse. Most smokers quit 3 times before they are successful.   If you relapse, do not give up! Plan ahead and think about what you will do the next time you get the urge to smoke.  LIFE AS A NONSMOKER: MAKE IT FOR A MONTH, MAKE IT FOR LIFE Day 1 Hang this page where you will see it every day. Day 2 Get rid of all ashtrays, matches, and lighters. Day 3 Drink water. Breathe deeply between sips. Day 4 Avoid places with smoke-filled air, such as bars, clubs, or the smoking section of restaurants. Day 5 Keep track of how much money you save by not smoking. Day 6 Avoid boredom. Keep a good book with you or go to the movies. Day 7 Reward yourself! One week without smoking! Day 8 Make a dental appointment to get your teeth cleaned. Day 9 Decide how you will turn down a cigarette before it is offered to you. Day 10 Review your reasons for quitting. Day 11 Distract yourself. Stay active to keep your mind off smoking and to relieve tension. Take a walk, exercise, read a book, do a crossword puzzle, or try a new hobby. Day 12 Exercise. Get off the  bus before your stop or use stairs instead of escalators. Day 13 Call on friends for support and encouragement. Day 14 Reward yourself! Two weeks without smoking! Day 15 Practice deep breathing exercises. Day 16 Bet a friend that you can stay a nonsmoker. Day 17 Ask to sit in nonsmoking sections of restaurants. Day 18 Hang up "No Smoking" signs. Day 19 Think of yourself as a nonsmoker. Day 20 Each morning, tell yourself you will not smoke. Day 21 Reward yourself! Three weeks without smoking! Day 22 Think of smoking in negative ways.   Remember how it stains your teeth, gives you bad breath, and shortens your breath. Day 23 Eat a nutritious breakfast. Day 24 Do not relive your days as a smoker. Day 25 Hold a pencil in your hand when talking on the telephone. Day 26 Tell all your friends you do not smoke. Day 27 Think about how much better food tastes. Day 28 Remember, one cigarette is one too many. Day 29 Take up a hobby that will keep your hands busy. Day 30 Congratulations! One month without smoking! Give yourself a big reward. Your caregiver can direct you to community resources or hospitals for support, which may include:  Group support.   Education.   Hypnosis.   Subliminal therapy.  Document Released: 01/09/2004 Document Re-Released: 07/07/2009 [facility] Patient Information 2011 [place], [place].

## 2010-12-10 ENCOUNTER — Other Ambulatory Visit: Payer: Self-pay | Admitting: Licensed Clinical Social Worker

## 2010-12-10 DIAGNOSIS — Z720 Tobacco use: Secondary | ICD-10-CM

## 2010-12-10 MED ORDER — VARENICLINE TARTRATE 1 MG PO TABS: 1.0000 mg | ORAL_TABLET | ORAL | Status: DC

## 2010-12-10 MED ORDER — VARENICLINE TARTRATE 0.5 MG PO TABS: 0.5000 mg | ORAL_TABLET | ORAL | Status: DC

## 2010-12-21 ENCOUNTER — Ambulatory Visit: Payer: Self-pay

## 2011-01-08 ENCOUNTER — Other Ambulatory Visit: Payer: Self-pay | Admitting: Adult Health

## 2011-01-08 DIAGNOSIS — Z720 Tobacco use: Secondary | ICD-10-CM

## 2011-01-08 MED ORDER — VARENICLINE TARTRATE 1 MG PO TABS: 1.0000 mg | ORAL_TABLET | ORAL | Status: DC

## 2011-01-18 LAB — T-HELPER CELL (CD4) - (RCID CLINIC ONLY): CD4 % Helper T Cell: 37

## 2011-01-20 ENCOUNTER — Telehealth: Payer: Self-pay | Admitting: Adult Health

## 2011-01-20 NOTE — Telephone Encounter (Signed)
A letter was mailed to Kenneth [name] to remind him it is time to re-enroll to [employer] for his Chantix.

## 2011-01-21 LAB — T-HELPER CELL (CD4) - (RCID CLINIC ONLY): CD4 T Cell Abs: 750

## 2011-01-27 ENCOUNTER — Other Ambulatory Visit: Payer: Self-pay | Admitting: *Deleted

## 2011-01-27 DIAGNOSIS — Z720 Tobacco use: Secondary | ICD-10-CM

## 2011-01-27 LAB — T-HELPER CELL (CD4) - (RCID CLINIC ONLY)
CD4 % Helper T Cell: 46
CD4 T Cell Abs: 1080

## 2011-01-27 MED ORDER — VARENICLINE TARTRATE 1 MG PO TABS: 1.0000 mg | ORAL_TABLET | ORAL | Status: DC

## 2011-01-27 NOTE — Telephone Encounter (Signed)
Pt had called asking for the Chantix to be renewed for the next year.I called him to make an appt to come in. He states he has already signed an app for that. There is not an app in his file or any other place I checked. He is upset & does not want to come back in. I offered to mail it to him. He accepted that. Will mail after md signed app & rx

## 2011-01-28 NOTE — Telephone Encounter (Signed)
He did not want to come in to complete the form again. Mailed the completed form & rx to him with an envelope addressed to the company he is to mail it to

## 2011-02-02 LAB — COMPREHENSIVE METABOLIC PANEL
ALT: 50
AST: 26
Alkaline Phosphatase: 80
CO2: 30
Chloride: 106
GFR calc non Af Amer: >60
Glucose, Bld: 96
Potassium: 3.9
Sodium: 141
Total Bilirubin: 0.7

## 2011-02-02 LAB — DIFFERENTIAL
Basophils Relative: 0
Eosinophils Absolute: 0.2
Eosinophils Relative: 2
Neutrophils Relative %: 62

## 2011-02-02 LAB — T-HELPER CELL (CD4) - (RCID CLINIC ONLY)
CD4 % Helper T Cell: 32 — ABNORMAL LOW
CD4 T Cell Abs: 800

## 2011-02-02 LAB — CBC
Hemoglobin: 14
MCHC: 33.8
RBC: 4.83
WBC: 8.6

## 2011-02-08 LAB — T-HELPER CELL (CD4) - (RCID CLINIC ONLY)
CD4 % Helper T Cell: 31 — ABNORMAL LOW
CD4 T Cell Abs: 610

## 2011-04-05 ENCOUNTER — Other Ambulatory Visit (INDEPENDENT_AMBULATORY_CARE_PROVIDER_SITE_OTHER): Payer: Self-pay

## 2011-04-05 DIAGNOSIS — B20 Human immunodeficiency virus [HIV] disease: Secondary | ICD-10-CM

## 2011-04-05 NOTE — Progress Notes (Signed)
Addended by: Mariea Clonts D on: 04/05/2011 02:25 PM   Modules accepted: Orders

## 2011-04-06 ENCOUNTER — Telehealth: Payer: Self-pay | Admitting: Infectious Diseases

## 2011-04-06 LAB — COMPREHENSIVE METABOLIC PANEL
AST: 13 U/L (ref 0–37)
Albumin: 4.6 g/dL (ref 3.5–5.2)
Alkaline Phosphatase: 70 U/L (ref 39–117)
BUN: 11 mg/dL (ref 6–23)
Glucose, Bld: 71 mg/dL (ref 70–99)
Potassium: 4.1 mEq/L (ref 3.5–5.3)
Total Bilirubin: 0.3 mg/dL (ref 0.3–1.2)

## 2011-04-06 LAB — CBC WITH DIFFERENTIAL/PLATELET
Basophils Absolute: 0.1 10*3/uL (ref 0.0–0.1)
Basophils Relative: 1 % (ref 0–1)
Eosinophils Absolute: 0.1 10*3/uL (ref 0.0–0.7)
MCH: 28.3 pg (ref 26.0–34.0)
MCHC: 32.8 g/dL (ref 30.0–36.0)
Monocytes Absolute: 0.6 10*3/uL (ref 0.1–1.0)
Neutro Abs: 4 10*3/uL (ref 1.7–7.7)
Neutrophils Relative %: 58 % (ref 43–77)
RDW: 13.8 % (ref 11.5–15.5)

## 2011-04-06 LAB — T-HELPER CELL (CD4) - (RCID CLINIC ONLY)
CD4 % Helper T Cell: 46 % (ref 33–55)
CD4 T Cell Abs: 1030 uL (ref 400–2700)

## 2011-04-06 NOTE — Telephone Encounter (Signed)
Received application from patient.  Mailing today

## 2011-04-07 LAB — HIV-1 RNA QUANT-NO REFLEX-BLD: HIV-1 RNA Quant, Log: <1.3 {Log} (ref <1.30)

## 2011-04-15 ENCOUNTER — Ambulatory Visit: Payer: Self-pay | Admitting: Infectious Diseases

## 2011-04-23 ENCOUNTER — Other Ambulatory Visit: Payer: Self-pay | Admitting: *Deleted

## 2011-04-23 ENCOUNTER — Telehealth: Payer: Self-pay | Admitting: *Deleted

## 2011-04-23 DIAGNOSIS — Z720 Tobacco use: Secondary | ICD-10-CM

## 2011-04-23 MED ORDER — VARENICLINE TARTRATE 1 MG PO TABS: 1.0000 mg | ORAL_TABLET | ORAL | Status: DC

## 2011-04-23 NOTE — Telephone Encounter (Signed)
Left patient voice mail that patient assistance medication for Viagra has arrived. Wendall Mola CMA

## 2011-04-23 NOTE — Telephone Encounter (Signed)
Previous error on phone note.  Patient PA medication for chantix arrived.  Left message on patient's voice mail ready for pick up. Wendall Mola CMA

## 2011-04-28 ENCOUNTER — Telehealth: Payer: Self-pay | Admitting: Infectious Diseases

## 2011-04-28 ENCOUNTER — Ambulatory Visit (INDEPENDENT_AMBULATORY_CARE_PROVIDER_SITE_OTHER): Payer: Self-pay | Admitting: Infectious Diseases

## 2011-04-28 ENCOUNTER — Encounter: Payer: Self-pay | Admitting: Infectious Diseases

## 2011-04-28 ENCOUNTER — Ambulatory Visit: Payer: Self-pay | Admitting: Infectious Diseases

## 2011-04-28 VITALS — BP 134/77 | HR 94 | Temp 97.9°F | Ht 72.0 in | Wt 172.0 lb

## 2011-04-28 DIAGNOSIS — G4700 Insomnia, unspecified: Secondary | ICD-10-CM | POA: Insufficient documentation

## 2011-04-28 DIAGNOSIS — B20 Human immunodeficiency virus [HIV] disease: Secondary | ICD-10-CM

## 2011-04-28 DIAGNOSIS — Z23 Encounter for immunization: Secondary | ICD-10-CM

## 2011-04-28 DIAGNOSIS — Z113 Encounter for screening for infections with a predominantly sexual mode of transmission: Secondary | ICD-10-CM

## 2011-04-28 DIAGNOSIS — R51 Headache: Secondary | ICD-10-CM

## 2011-04-28 DIAGNOSIS — G479 Sleep disorder, unspecified: Secondary | ICD-10-CM

## 2011-04-28 DIAGNOSIS — A630 Anogenital (venereal) warts: Secondary | ICD-10-CM

## 2011-04-28 DIAGNOSIS — Z79899 Other long term (current) drug therapy: Secondary | ICD-10-CM

## 2011-04-28 DIAGNOSIS — Z87891 Personal history of nicotine dependence: Secondary | ICD-10-CM

## 2011-04-28 MED ORDER — ESZOPICLONE 2 MG PO TABS: 2.0000 mg | ORAL_TABLET | Freq: Every evening | ORAL | Status: DC | PRN

## 2011-04-28 NOTE — Assessment & Plan Note (Signed)
Denies penile or anal lesions currently. Will continue to watch

## 2011-04-28 NOTE — Assessment & Plan Note (Signed)
He is doing very well. Gets flu shot, offered condoms. Will see him back in 4-5 months with labs prior.

## 2011-04-28 NOTE — Telephone Encounter (Signed)
Called to see if assistance was available for Lunesta.  The only thing available is a copay cared for $50.00.  The patient is going to call around to different drug stores to see what the cost for the Kenneth [name] is.

## 2011-04-28 NOTE — Assessment & Plan Note (Signed)
Trying to quit. Starting chantix today.

## 2011-04-28 NOTE — Assessment & Plan Note (Signed)
Will continue topamax. This will be an ongoing problem.

## 2011-04-28 NOTE — Progress Notes (Signed)
  Subjective:    Patient ID: Kenneth [name], male    DOB: 1980-05-19, 31 y.o.   MRN: [id number]  HPI 31 yo M with hx of HIV+ (Dx 2006?), tension/cluster headaches.  Last CD4 1030, CD4 <20 (04-05-11).  States he has had headaches since November, but none for last couple of weeks. Not clear why they have improved.  Can't identify triggers. Thinks topamax has helped.    Review of Systems  Constitutional: Negative for appetite change and unexpected weight change.  Respiratory: Negative for shortness of breath.   Cardiovascular: Negative for chest pain.  Gastrointestinal: Negative for diarrhea and constipation.  Genitourinary: Negative for dysuria and genital sores.  Neurological: Positive for headaches.  Psychiatric/Behavioral: Positive for sleep disturbance.       Objective:   Physical Exam  Constitutional: He appears well-developed and well-nourished.  HENT:  Mouth/Throat: No oropharyngeal exudate.  Eyes: EOM are normal. Pupils are equal, round, and reactive to light.  Neck: Neck supple.  Cardiovascular: Normal rate, regular rhythm and normal heart sounds.   Pulmonary/Chest: Effort normal and breath sounds normal.  Abdominal: Soft. Bowel sounds are normal. There is no tenderness.  Lymphadenopathy:    He has no cervical adenopathy.          Assessment & Plan:

## 2011-04-28 NOTE — Assessment & Plan Note (Signed)
States he has taken either [place] or [place] prior. Will write new rx, see how he tolerates.

## 2011-06-01 NOTE — Progress Notes (Signed)
Subjective:    Patient ID: Kenneth [name] is a 31 y.o. male.  Chief Complaint: HIV Follow-up Visit PARV MANTHEY is here for follow-up of HIV infection. He is feeling unchanged since his last visit.  He claims continued adherence to therapy with good tolerance and no complications. There are not additional complaints. He is voicing significant interest in smoking cessation, especially as a New Year resolution. He is asking specifically about Chantix therapy.  Data Review: Diagnostic studies reviewed.  Review of Systems - General ROS: negative for - fatigue, fever or malaise Psychological ROS: negative for - anxiety, behavioral disorder, concentration difficulties, depression, hostility, irritability, memory difficulties or mood swings Ophthalmic ROS: negative ENT ROS: negative Respiratory ROS: no cough, shortness of breath, or wheezing Cardiovascular ROS: no chest pain or dyspnea on exertion Gastrointestinal ROS: no abdominal pain, change in bowel habits, or black or bloody stools Neurological ROS: no TIA or stroke symptoms Dermatological ROS: negative for rash and skin lesion changes  Objective:   General appearance: alert, cooperative and no distress Head: Normocephalic, without obvious abnormality, atraumatic Eyes: conjunctivae/corneas clear. PERRL, EOM's intact. Fundi benign. Ears: normal TM's and external ear canals both ears Throat: lips, mucosa, and tongue normal; teeth and gums normal Neck: no adenopathy, no carotid bruit, no JVD, supple, symmetrical, trachea midline and thyroid not enlarged, symmetric, no tenderness/mass/nodules Resp: clear to auscultation bilaterally Cardio: regular rate and rhythm, S1, S2 normal, no murmur, click, rub or gallop GI: soft, non-tender; bowel sounds normal; no masses,  no organomegaly Extremities: extremities normal, atraumatic, no cyanosis or edema Pulses: 2+ and symmetric Skin: Skin color, texture, turgor normal. No rashes or  lesions Neurologic: Grossly normal Psych:  No vegetative signs or delusional behaviors noted.    Laboratory: From 11/23/2010 ,  CD4 count was 1000 c/cmm @ 47 %. Viral load <20 copies/ml.     Assessment/Plan:   HIV DISEASE Clinically stable on current regimen. Continue present management.  Counseling provided on prevention of transmission of HIV. Condoms offered:  Yes Medication adherence discussed with patient.  Follow up visit in 4 months with labs 2 weeks prior to appointment. Patient verbally acknowledged information provided to them and agreed with plan of care.   HX, PERSONAL, TOBACCO USE Discussed with him measures to plan for smoking cessation and while Chantix is a reasonable intervention, he was advised that other measures might be tried first before starting Chantix. Written material on smoking cessation were provided for him to discuss the various options, and techniques in managing, tobacco addiction. I offered to readdress this with him on his next visit and at that time, if he still wanted to try Chantix, we will begin that process. He verbally acknowledged all information that I provided for him and agreed with our current plan.     Salar Molden A. Sundra Aland, MS, [facility] [phone]  06/01/2011, 4:21 PM

## 2011-06-01 NOTE — Assessment & Plan Note (Signed)
Discussed with him measures to plan for smoking cessation and while Chantix is a reasonable intervention, he was advised that other measures might be tried first before starting Chantix. Written material on smoking cessation were provided for him to discuss the various options, and techniques in managing, tobacco addiction. I offered to readdress this with him on his next visit and at that time, if he still wanted to try Chantix, we will begin that process. He verbally acknowledged all information that I provided for him and agreed with our current plan.

## 2011-06-01 NOTE — Assessment & Plan Note (Signed)
Clinically stable on current regimen. Continue present management.  Counseling provided on prevention of transmission of HIV. Condoms offered:  Yes Medication adherence discussed with patient.  Follow up visit in 4 months with labs 2 weeks prior to appointment. Patient verbally acknowledged information provided to them and agreed with plan of care.  

## 2011-06-21 ENCOUNTER — Other Ambulatory Visit: Payer: Self-pay | Admitting: *Deleted

## 2011-06-21 DIAGNOSIS — G44009 Cluster headache syndrome, unspecified, not intractable: Secondary | ICD-10-CM

## 2011-06-21 MED ORDER — TOPIRAMATE 25 MG PO TABS: 50.0000 mg | ORAL_TABLET | Freq: Two times a day (BID) | ORAL | Status: DC

## 2011-06-28 ENCOUNTER — Other Ambulatory Visit: Payer: Self-pay | Admitting: *Deleted

## 2011-06-28 ENCOUNTER — Telehealth: Payer: Self-pay | Admitting: Infectious Diseases

## 2011-06-28 DIAGNOSIS — G44009 Cluster headache syndrome, unspecified, not intractable: Secondary | ICD-10-CM

## 2011-06-28 MED ORDER — TOPIRAMATE 25 MG PO TABS: 50.0000 mg | ORAL_TABLET | Freq: Two times a day (BID) | ORAL | Status: DC

## 2011-06-28 NOTE — Telephone Encounter (Signed)
Received call from Kenneth [name].  Tried to get his Topamax from the pharmacy.  Was told it was not covered.  Called [employer].  They said prescription must be written for the brand name.  Gave to nurse to call it in.

## 2011-07-21 ENCOUNTER — Ambulatory Visit: Payer: Self-pay

## 2011-08-11 ENCOUNTER — Ambulatory Visit (INDEPENDENT_AMBULATORY_CARE_PROVIDER_SITE_OTHER): Payer: Self-pay | Admitting: Internal Medicine

## 2011-08-11 ENCOUNTER — Encounter: Payer: Self-pay | Admitting: Internal Medicine

## 2011-08-11 VITALS — BP 100/63 | HR 69 | Temp 98.5°F | Ht 72.0 in | Wt 175.5 lb

## 2011-08-11 DIAGNOSIS — G478 Other sleep disorders: Secondary | ICD-10-CM

## 2011-08-11 DIAGNOSIS — G479 Sleep disorder, unspecified: Secondary | ICD-10-CM

## 2011-08-11 DIAGNOSIS — A630 Anogenital (venereal) warts: Secondary | ICD-10-CM

## 2011-08-11 DIAGNOSIS — B20 Human immunodeficiency virus [HIV] disease: Secondary | ICD-10-CM

## 2011-08-11 MED ORDER — DIPHENHYDRAMINE HCL 25 MG PO CAPS: 25.0000 mg | ORAL_CAPSULE | Freq: Every evening | ORAL | Status: DC | PRN

## 2011-08-11 NOTE — Progress Notes (Signed)
Patient ID: Kenneth [name], male   DOB: Mar 24, 1981, 31 y.o.   MRN: [id number]  INFECTIOUS DISEASE PROGRESS NOTE    Subjective: Kenneth [name] is in for his routine visit. He has not missed a single dose of his Atripla since his last visit. He has been taking Chantix and it has helped him cut down on his cigarettes but he has not tried to quit completely yet. He has been bothered by seasonal allergies recently but finds the Claritin helps. He continues to have trouble falling asleep at night. He use to take Lunesta but cannot afford it and has not tried anything else. For the last month he has been bothered by perirectal pain. He notes the pain pretty much all of the time. It is not worsened by a bowel movement. He has not noticed any bleeding. He has had surgery for perirectal warts on 2 occasions, the last in 2008. His pain feels similar to how he felt prior to those surgeries.  Objective: Temp: 98.5 F (36.9 C) (04/17 1531) Temp src: Oral (04/17 1531) BP: 100/63 mmHg (04/17 1531) Pulse Rate: 69  (04/17 1531)  General: He is healthy-appearing in no distress Oral: Clear Skin: No rash Lungs: Clear Cor: Regular S1-S2 no murmurs Abdomen: Soft nontender They're no hemorrhoids, warts, skin tags or evidence of fissures. On internal digital exam there is some very mild nodularity at the 12:00 position.  Lab Results HIV 1 RNA Quant (copies/mL)  Date Value  04/05/2011 <20   11/23/2010 <20   05/05/2010 <20 copies/mL      CD4 T Cell Abs (cmm)  Date Value  04/05/2011 1030   11/23/2010 1000   05/05/2010 1160      Assessment: His HIV infection remains under excellent control. I will continue his Atripla.  I've given him the number for the [place] quit line and asked him to call to help firm up a plan to set a quit date and quit cigarettes completely.  I will see if he can be seen again at [facility] surgery for consideration of endoscopy.  Plan: 1. Continue Atripla 2. Cigarette  cessation counseling 3. Referral for general surgery reevaluation 4. Return to clinic after lab work in 6 months   Cliffton Asters, MD [facility] for Infectious Diseases [facility] [phone] pager   [phone] cell 08/11/2011, 4:13 PM

## 2011-08-13 ENCOUNTER — Ambulatory Visit: Payer: Self-pay

## 2011-08-16 ENCOUNTER — Ambulatory Visit: Payer: Self-pay

## 2011-08-23 NOTE — Progress Notes (Signed)
Addended by: Jennet Maduro D on: 08/23/2011 10:39 AM   Modules accepted: Orders

## 2011-08-24 ENCOUNTER — Telehealth: Payer: Self-pay | Admitting: *Deleted

## 2011-08-24 NOTE — Telephone Encounter (Signed)
Patient called to check and see if his referral to Gen Surgery for the condylomas had been scheduled. Asked the patient when he received his [employer] as that was the hold up. He said that he got it to [place] on 08/20/11. Advised him that the referral appt could not have been scheduled until he got the card and that is why it took so long but now that it is done we will schedule the appt and someone will give him a call as soon as it is done.  Will check with the referral coordinator to see if she is aware that the [facility] has been obtained and she can not schedule the appt.

## 2011-08-25 ENCOUNTER — Encounter: Payer: Self-pay | Admitting: *Deleted

## 2011-08-25 NOTE — Progress Notes (Signed)
Patient ID: Kenneth [name], male   DOB: April 04, 1981, 31 y.o.   MRN: [id number]  Called patient and left message that I have sent request to the referral department of [facility]) and will contact him when receive any word back from them. Tacey Heap RN

## 2011-08-26 ENCOUNTER — Telehealth: Payer: Self-pay | Admitting: *Deleted

## 2011-08-26 NOTE — Telephone Encounter (Signed)
States he finally got his orange card & [facility] will not see him . He is very upset.  I told him we will call to other surgeons & try to get an appt. He refuses to travel to [facility] or [place]. States that would be inconvinent. States he is in pain & wants to know if there is something the md can give him while he waits. He was even more upset when I told him it may be weeks before a doctor can see him. I explained that the orange card is not accepted everywhere & we have to wait sometimes for a volunteer md.  fyi to Dr. Orvan Falconer to see if he can get something for the pain  Also forwarded to E. E. RN to keep trying to fins a doctor for this problem

## 2011-08-26 NOTE — Telephone Encounter (Signed)
He does not currently have any perirectal warts so Aldara cream is not indicated. He can try Preparation H for pain and discomfort while we try to arrange general surgery referral.

## 2011-08-26 NOTE — Telephone Encounter (Signed)
States he wants aldara which has helped in the past. Does not want pain meds. States he will go to [facility] if none to be found in [place]  To md

## 2011-08-26 NOTE — Telephone Encounter (Signed)
I spoke with Dr. Orvan Falconer who said Aldara is not indicated. Advised that he try preparation H. I told pt this & he will try it.I assured him that we will try to get him an appt asap

## 2011-08-31 ENCOUNTER — Telehealth: Payer: Self-pay | Admitting: Licensed Clinical Social Worker

## 2011-08-31 DIAGNOSIS — B20 Human immunodeficiency virus [HIV] disease: Secondary | ICD-10-CM

## 2011-08-31 MED ORDER — EFAVIRENZ-EMTRICITAB-TENOFOVIR 600-200-300 MG PO TABS: 1.0000 | ORAL_TABLET | Freq: Every day | ORAL | Status: DC

## 2011-08-31 NOTE — Telephone Encounter (Signed)
Patient called today upset because he has not been given an appt yet for the surgery consult. Advised he has used Aldera in the past and it worked for him. Advised that the hemorrhoid cream is not working and he is in pain. Advised the patient that we can not give him an Rx for [facility], he will have to talk to the provider about that. Advised him that [facility] will not see and Orange card patients at this time and until they will we are at a lost as we do not know who to send him to as an uninsured patient. Advised him will ask around and see what we can do and maybe we can come up with something. Someone will call him back when we come up with something.

## 2011-09-02 ENCOUNTER — Encounter: Payer: Self-pay | Admitting: *Deleted

## 2011-09-02 NOTE — Progress Notes (Signed)
Patient ID: RAYN ENDERSON, male   DOB: 25-Mar-1981, 31 y.o.   MRN: [id number]  Faxed referral to Judeth Cornfield at [facility] for [facility] [phone] / w: [phone]) to see if they could find a general surgeon close by that will take [facility] card. Will cont to follow. Tacey Heap RN

## 2011-09-06 ENCOUNTER — Telehealth: Payer: Self-pay | Admitting: *Deleted

## 2011-09-06 NOTE — Telephone Encounter (Signed)
He is not pleased that we have been unable to find a surgeon who will see him. I explained again that without insurance, we have to wait for a volunteer md. States he cannot go to [facility], but may be able to go to [facility]. I told him I will  Send this to the RN who is working on it & she will call him when she gets an answer

## 2011-09-09 ENCOUNTER — Encounter: Payer: Self-pay | Admitting: *Deleted

## 2011-09-09 ENCOUNTER — Other Ambulatory Visit: Payer: Self-pay | Admitting: *Deleted

## 2011-09-09 ENCOUNTER — Telehealth: Payer: Self-pay | Admitting: Licensed Clinical Social Worker

## 2011-09-09 DIAGNOSIS — G44009 Cluster headache syndrome, unspecified, not intractable: Secondary | ICD-10-CM

## 2011-09-09 MED ORDER — TOPIRAMATE 25 MG PO TABS: 50.0000 mg | ORAL_TABLET | Freq: Two times a day (BID) | ORAL | Status: DC

## 2011-09-09 NOTE — Progress Notes (Signed)
Patient ID: Kenneth [name], male   DOB: [birth year], 31 y.o.   MRN: [id number]  Pt called and stated he could not afford to pay the $226 for the initial visit at [facility] and was requesting another facility. I spoke with [facility] financial department and they were willing to see him for $60. I have faxed OV, labs, demo to [facility]. [phone]; [phone]). I spoke with their financial department and they request $20 payment at initial visit and if surgery was to be performed they would have him see their financial dept.   I relayed the above to the patient and he stated he could pay $20 for this visit. Will hopefully hear back from [facility] if they will see him. Tacey Heap RN

## 2011-09-09 NOTE — Telephone Encounter (Signed)
Patient called stating that he had an appointment with [facility] but they wanted him to pay for the consultation which is 220.00 he could not do this. He wants [place] to cancel the appointment and pursue an appointment with [facility] instead. Doyne Keel has sent them the paperwork already. The patient also asked about Aldara cream and it was denied by Dr. Orvan Falconer because he did not have any active warts at the visit. The patient states he has them now and would like an appointment. Appointment made and patient aware.

## 2011-09-16 ENCOUNTER — Encounter: Payer: Self-pay | Admitting: *Deleted

## 2011-09-16 NOTE — Progress Notes (Signed)
Patient ID: Kenneth [name], male   DOB: 05/10/1980, 30 y.o.   MRN: [id number]  Pt has an appointment with [facility] [phone]) on Monday, October 04, 2011 at 1pm. Have called and sent letter to patient as a reminder. Tacey Heap RN

## 2011-09-17 ENCOUNTER — Telehealth: Payer: Self-pay | Admitting: *Deleted

## 2011-09-17 DIAGNOSIS — G44009 Cluster headache syndrome, unspecified, not intractable: Secondary | ICD-10-CM

## 2011-09-17 MED ORDER — TOPIRAMATE 25 MG PO TABS: 50.0000 mg | ORAL_TABLET | Freq: Two times a day (BID) | ORAL | Status: DC

## 2011-09-17 NOTE — Telephone Encounter (Signed)
I notified pt that the topamax has been approved & that I was sending it in to his local pharmacy. I told him he has refills & when he has 1 wk worth of pills left, call them for refills

## 2011-09-21 ENCOUNTER — Ambulatory Visit (INDEPENDENT_AMBULATORY_CARE_PROVIDER_SITE_OTHER): Payer: Self-pay | Admitting: Internal Medicine

## 2011-09-21 ENCOUNTER — Other Ambulatory Visit: Payer: Self-pay | Admitting: *Deleted

## 2011-09-21 ENCOUNTER — Encounter: Payer: Self-pay | Admitting: Internal Medicine

## 2011-09-21 VITALS — BP 127/79 | HR 73 | Temp 98.0°F | Ht 72.0 in | Wt 189.0 lb

## 2011-09-21 DIAGNOSIS — A630 Anogenital (venereal) warts: Secondary | ICD-10-CM

## 2011-09-21 MED ORDER — IMIQUIMOD 2.5 % EX CREA: 1.0000 "application " | TOPICAL_CREAM | CUTANEOUS | Status: DC

## 2011-09-21 NOTE — Assessment & Plan Note (Signed)
Will try aldara on area.  Instructions given.  Friend to apply.   Will see surgery next month and then follow up with primary HIV doctor.

## 2011-09-21 NOTE — Progress Notes (Signed)
  Subjective:    Patient ID: Kenneth [name], male    DOB: 1981-02-15, 31 y.o.   MRN: [id number]  HPI  Here for follow up of anal warts.  Has had in past and did get surgery.  Is referred to [facility] in June.  Feels he has one growing back now in same spot.    Review of Systems  Constitutional: Negative for fever.  Gastrointestinal: Negative for diarrhea and constipation.  Genitourinary:       Wart       Objective:   Physical Exam  Constitutional: He appears well-developed and well-nourished. No distress.  Skin:       Perianal area of growth on right area (about 3 o'clock)          Assessment & Plan:

## 2011-09-30 ENCOUNTER — Encounter (INDEPENDENT_AMBULATORY_CARE_PROVIDER_SITE_OTHER): Payer: Self-pay | Admitting: Surgery

## 2011-10-27 ENCOUNTER — Other Ambulatory Visit: Payer: Self-pay

## 2011-11-10 ENCOUNTER — Ambulatory Visit: Payer: Self-pay | Admitting: Infectious Diseases

## 2011-12-08 ENCOUNTER — Ambulatory Visit: Payer: Self-pay

## 2011-12-20 ENCOUNTER — Telehealth: Payer: Self-pay | Admitting: *Deleted

## 2011-12-20 NOTE — Telephone Encounter (Signed)
Opened in error

## 2011-12-21 ENCOUNTER — Other Ambulatory Visit: Payer: Self-pay | Admitting: Infectious Diseases

## 2011-12-21 ENCOUNTER — Other Ambulatory Visit (INDEPENDENT_AMBULATORY_CARE_PROVIDER_SITE_OTHER): Payer: Self-pay

## 2011-12-21 ENCOUNTER — Other Ambulatory Visit: Payer: Self-pay | Admitting: *Deleted

## 2011-12-21 DIAGNOSIS — G44009 Cluster headache syndrome, unspecified, not intractable: Secondary | ICD-10-CM

## 2011-12-21 DIAGNOSIS — B20 Human immunodeficiency virus [HIV] disease: Secondary | ICD-10-CM

## 2011-12-21 DIAGNOSIS — Z79899 Other long term (current) drug therapy: Secondary | ICD-10-CM

## 2011-12-21 DIAGNOSIS — Z113 Encounter for screening for infections with a predominantly sexual mode of transmission: Secondary | ICD-10-CM

## 2011-12-21 LAB — LIPID PANEL
Cholesterol: 169 mg/dL (ref 0–200)
LDL Cholesterol: 112 mg/dL — ABNORMAL HIGH (ref 0–99)
Total CHOL/HDL Ratio: 4.4 Ratio
VLDL: 19 mg/dL (ref 0–40)

## 2011-12-21 LAB — CBC
HCT: 40.4 % (ref 39.0–52.0)
MCHC: 34.7 g/dL (ref 30.0–36.0)
Platelets: 295 10*3/uL (ref 150–400)
RDW: 14.4 % (ref 11.5–15.5)
WBC: 6.8 10*3/uL (ref 4.0–10.5)

## 2011-12-21 MED ORDER — TOPIRAMATE 25 MG PO TABS: 50.0000 mg | ORAL_TABLET | Freq: Two times a day (BID) | ORAL | Status: DC

## 2011-12-21 NOTE — Telephone Encounter (Signed)
Rx printed to go with patient PAP application. 

## 2011-12-22 ENCOUNTER — Telehealth: Payer: Self-pay | Admitting: Internal Medicine

## 2011-12-22 LAB — HIV-1 RNA QUANT-NO REFLEX-BLD
HIV 1 RNA Quant: <20 copies/mL (ref <20)
HIV-1 RNA Quant, Log: <1.3 {Log} (ref <1.30)

## 2011-12-22 LAB — COMPLETE METABOLIC PANEL WITH GFR
ALT: 13 U/L (ref 0–53)
AST: 14 U/L (ref 0–37)
Albumin: 4.8 g/dL (ref 3.5–5.2)
Alkaline Phosphatase: 59 U/L (ref 39–117)
BUN: 11 mg/dL (ref 6–23)
Calcium: 9.8 mg/dL (ref 8.4–10.5)
Chloride: 109 mEq/L (ref 96–112)
Potassium: 4 mEq/L (ref 3.5–5.3)
Sodium: 144 mEq/L (ref 135–145)
Total Protein: 7.2 g/dL (ref 6.0–8.3)

## 2011-12-22 LAB — T-HELPER CELL (CD4) - (RCID CLINIC ONLY)
CD4 % Helper T Cell: 45 % (ref 33–55)
CD4 T Cell Abs: 1240 uL (ref 400–2700)

## 2011-12-22 NOTE — Telephone Encounter (Signed)
Faxed and mailed Kenneth [name] application to [employer] for his Topamax today.

## 2012-01-02 ENCOUNTER — Emergency Department (HOSPITAL_COMMUNITY)
Admission: EM | Admit: 2012-01-02 | Discharge: 2012-01-02 | Disposition: A | Discharge status: Home / Self Care | Payer: Self-pay | Attending: Emergency Medicine | Admitting: Emergency Medicine

## 2012-01-02 DIAGNOSIS — R51 Headache: Secondary | ICD-10-CM | POA: Insufficient documentation

## 2012-01-02 NOTE — ED Notes (Signed)
At work ~ 1530 constant h/a rt. Side of head. Wear oxygen at home as needed for cluster h/as. Taken topamx.

## 2012-01-02 NOTE — ED Notes (Signed)
Updated pt on wait time and placed pt on simple mask and oxygen for cluster headache

## 2012-01-02 NOTE — ED Notes (Signed)
Pt updated on times, pt told he had one person ahead of him and pt did not want to wait. Pt told about empty rooms avalabilty. Pt still refusing to stay, pt states will go to PCP in the morning.

## 2012-01-03 ENCOUNTER — Telehealth: Payer: Self-pay | Admitting: *Deleted

## 2012-01-03 ENCOUNTER — Ambulatory Visit (INDEPENDENT_AMBULATORY_CARE_PROVIDER_SITE_OTHER): Payer: Self-pay | Admitting: Internal Medicine

## 2012-01-03 ENCOUNTER — Encounter: Payer: Self-pay | Admitting: *Deleted

## 2012-01-03 VITALS — Wt 165.0 lb

## 2012-01-03 DIAGNOSIS — G43809 Other migraine, not intractable, without status migrainosus: Secondary | ICD-10-CM

## 2012-01-03 MED ORDER — VERAPAMIL HCL ER 180 MG PO TBCR: 180.0000 mg | EXTENDED_RELEASE_TABLET | Freq: Every day | ORAL | Status: DC

## 2012-01-03 MED ORDER — PREDNISONE 20 MG PO TABS: ORAL_TABLET | ORAL | Status: DC

## 2012-01-03 NOTE — Assessment & Plan Note (Signed)
Will do a trial of prednisone at 60mg  x 5 days, then taper by 10mg  per day

## 2012-01-03 NOTE — Telephone Encounter (Signed)
Pt reports cluster headache x 2 days.  Went to ED yesterday but states he waited 5 hours and was not seen to he left.  Requesting appt for today.  Given 2:45 appt this afternoon with Dr. Drue Second.

## 2012-01-03 NOTE — Progress Notes (Signed)
HIV CLINIC NOTE  RFV: cluster headaches Subjective:    Patient ID: Kenneth [name], male    DOB: [birth year], 31 y.o.   MRN: [id number]  HPI HIV, CD 4 count, 1240/ VL <20,on atripla; also has history of cluster headaches, currently on topamax 50mg  BID and oxygen supplement.  He reports having seasonal component, started in July- to present. He is coming to clinic to see if he can feel better. He went to ED last night, but waited 5hrs, and then left. He has right sided/right eye "feels like pop out of head", eyes runny,runny nose right forehead. Affects vision rarely. Oxygen helps +/- topamax. He does not use any other nsaids. Occasionally uses BC powder. He has had increasing frequency of his HA, had to go home x 3 times in the past week to get supplemental oxygen.  Current Outpatient Prescriptions on File Prior to Visit  Medication Sig Dispense Refill  . diphenhydrAMINE (BENADRYL) 25 mg capsule Take 1 capsule (25 mg total) by mouth at bedtime as needed for sleep.  30 capsule  3  . efavirenz-emtrictabine-tenofovir (ATRIPLA) 600-200-300 MG per tablet Take 1 tablet by mouth at bedtime.  30 tablet  11  . efavirenz-emtrictabine-tenofovir (ATRIPLA) 600-200-300 MG per tablet Take 1 tablet by mouth at bedtime.  30 tablet  12  . Imiquimod 2.5 % CREA Apply 1 application topically every other day.  7.5 g  0  . omeprazole (PRILOSEC OTC) 20 MG tablet Take 20 mg by mouth daily.        Marland Kitchen topiramate (TOPAMAX) 25 MG tablet Take 2 tablets (50 mg total) by mouth 2 (two) times daily.  120 tablet  6  . varenicline (CHANTIX) 1 MG tablet Take 1 tablet (1 mg total) by mouth as directed.  56 tablet  2   Active Ambulatory Problems    Diagnosis Date Noted  . HIV DISEASE 04/29/2006  . CONDYLOMA ACUMINATUM 04/29/2006  . HEADACHE, CLUSTER 11/25/2006  . RHINITIS, ALLERGIC NOS 09/23/2006  . DENTAL CARIES 04/29/2006  . Cigarette smoker 02/15/2007  . Insomnia 04/28/2011   Resolved Ambulatory Problems    Diagnosis Date  Noted  . HYPOKALEMIA 07/17/2009  . SINUSITIS, ACUTE NOS 02/15/2007  . TMJ SYNDROME 09/23/2006  . FOOT PAIN, RIGHT 09/23/2006  . Rash and other nonspecific skin eruption 12/27/2007  . Headache 06/08/2006  . NAUSEA AND VOMITING 06/09/2007   Past Medical History  Diagnosis Date  . HIV infection   . Headaches, cluster      Review of Systems Constitutional: Negative for fever, chills, diaphoresis, activity change, appetite change, fatigue and unexpected weight change.  HENT: Negative for congestion, sore throat, rhinorrhea, sneezing, trouble swallowing and sinus pressure.  Eyes: Negative for photophobia and visual disturbance.  Respiratory: Negative for cough, chest tightness, shortness of breath, wheezing and stridor.  Cardiovascular: Negative for chest pain, palpitations and leg swelling.  Gastrointestinal: Negative for nausea, vomiting, abdominal pain, diarrhea, constipation, blood in stool, abdominal distention and anal bleeding.  Genitourinary: Negative for dysuria, hematuria, flank pain and difficulty urinating.  Musculoskeletal: Negative for myalgias, back pain, joint swelling, arthralgias and gait problem.  Skin: Negative for color change, pallor, rash and wound.  Neurological: per hpi Hematological: Negative for adenopathy. Does not bruise/bleed easily.  Psychiatric/Behavioral: Negative for behavioral problems, confusion, sleep disturbance, dysphoric mood, decreased concentration and agitation.       Objective:   Physical Exam Wt 165 lb (74.844 kg)  Physical Exam  Constitutional: He is oriented to person, place, and time. He  appears well-developed and well-nourished. No distress.  HENT:  Mouth/Throat: Oropharynx is clear and moist. No oropharyngeal exudate.  Cardiovascular: Normal rate, regular rhythm and normal heart sounds. Exam reveals no gallop and no friction rub.  No murmur heard.  Pulmonary/Chest: Effort normal and breath sounds normal. No respiratory distress. He  has no wheezes.  Abdominal: Soft. Bowel sounds are normal. He exhibits no distension. There is no tenderness.  Lymphadenopathy:  He has no cervical adenopathy.  Neurological: He is alert and oriented to person, place, and time.  Skin: Skin is warm and dry. No rash noted. No erythema.  Psychiatric: He has a normal mood and affect. His behavior is normal.       Assessment & Plan:  Cluster headaches = he is currently on topamax 50mg  BID and oxygen for abortive therapy. He reports that topamax is not working and he is having to miss work frequently due to his headaches:  1)  will do steroid taper, also  add verapamil SR 180mg  and titrate up to 360mg  if his BP tolerates. He is unsure if he can afford the verapamil  - ideally will have him taper off of verapamil and topamax when his cluster season has stopped.  2) will see if we can get Patient assistance for injectable imitrex.  3) will fill out work form for him to explain his use of oxygen for his cluster headaches  rtc on 18th with hatcher

## 2012-01-12 ENCOUNTER — Encounter: Payer: Self-pay | Admitting: Infectious Diseases

## 2012-01-12 ENCOUNTER — Ambulatory Visit (INDEPENDENT_AMBULATORY_CARE_PROVIDER_SITE_OTHER): Payer: Self-pay | Admitting: Infectious Diseases

## 2012-01-12 VITALS — BP 144/67 | HR 61 | Temp 97.8°F | Ht 72.0 in | Wt 170.0 lb

## 2012-01-12 DIAGNOSIS — A630 Anogenital (venereal) warts: Secondary | ICD-10-CM

## 2012-01-12 DIAGNOSIS — Z113 Encounter for screening for infections with a predominantly sexual mode of transmission: Secondary | ICD-10-CM

## 2012-01-12 DIAGNOSIS — Z23 Encounter for immunization: Secondary | ICD-10-CM

## 2012-01-12 DIAGNOSIS — B20 Human immunodeficiency virus [HIV] disease: Secondary | ICD-10-CM

## 2012-01-12 DIAGNOSIS — G43809 Other migraine, not intractable, without status migrainosus: Secondary | ICD-10-CM

## 2012-01-12 DIAGNOSIS — Z79899 Other long term (current) drug therapy: Secondary | ICD-10-CM

## 2012-01-12 NOTE — Assessment & Plan Note (Signed)
Will try to get him into [facility]

## 2012-01-12 NOTE — Progress Notes (Signed)
  Subjective:    Patient ID: Kenneth [name], male    DOB: July 23, 1980, 31 y.o.   MRN: [id number]  HPI 31 yo M with hx of HIV+ (Dx 2006?), tension/cluster headaches. Was recently seen in ID and continued on his topamax as well as started on verapamil and prednisone taper. His headaches have been better.  No problems with atripla.  Has been using addara for his warts on his rectum. His last surgery was ? Here in [place].  He was referred to [facility] for f/u and has not yet made appt.  HIV 1 RNA Quant (copies/mL)  Date Value  12/21/2011 <20   04/05/2011 <20   11/23/2010 <20      CD4 T Cell Abs (cmm)  Date Value  12/21/2011 1240   04/05/2011 1030   11/23/2010 1000       Review of Systems  Constitutional: Negative for fever, chills, appetite change and unexpected weight change.  Respiratory: Negative for shortness of breath.   Gastrointestinal: Negative for diarrhea and constipation.  Genitourinary: Negative for dysuria.  Neurological: Positive for headaches.       Objective:   Physical Exam  Constitutional: He appears well-developed and well-nourished.  HENT:  Mouth/Throat: No oropharyngeal exudate.  Eyes: EOM are normal. Pupils are equal, round, and reactive to light.  Neck: Neck supple.  Cardiovascular: Normal rate, regular rhythm and normal heart sounds.   Pulmonary/Chest: Effort normal and breath sounds normal.  Abdominal: Soft. Bowel sounds are normal. He exhibits no distension. There is no tenderness.  Lymphadenopathy:    He has no cervical adenopathy.          Assessment & Plan:

## 2012-01-12 NOTE — Assessment & Plan Note (Signed)
He is doing well. Offered, refused condoms. No change in his meds. He gets flu shot today. See him back in 6 months with labs prior

## 2012-01-12 NOTE — Assessment & Plan Note (Signed)
Will hold on increasing his verapamil for his headaches. His headaches are under control. BP is good. He asks about fioricet, i advised him that this would be last resort, due to narcotic and potential for addiction.

## 2012-01-13 ENCOUNTER — Telehealth: Payer: Self-pay | Admitting: *Deleted

## 2012-01-13 NOTE — Telephone Encounter (Signed)
Mailed form to employer [employer]/[address], [place] [address]. The fax number the patient provided was not correct. The form was for special employee accommendations due to his migraines. Kenneth [name] CMA

## 2012-01-24 ENCOUNTER — Telehealth: Payer: Self-pay | Admitting: *Deleted

## 2012-01-24 NOTE — Telephone Encounter (Signed)
Patient called and advised that he has started to have the headaches again. He advised that for the last week they have been at night and they are bad. He also stated that he is no longer taking the prednisone and that when he was taking that along with his other meds he did not have the headaches and for a time after he stopped he did not have the headaches. Advised him will send the doctor a note to see what he wants to do because he was tapered off of the prednisone for a reason and I can not call in a refill without the doctors ok. Advised him that I will call him once I hear from Dr Ninetta Lights.

## 2012-01-25 ENCOUNTER — Ambulatory Visit: Payer: Self-pay | Admitting: Infectious Diseases

## 2012-01-27 NOTE — Telephone Encounter (Signed)
Pt needs to be seen by neuro for his headaches.

## 2012-01-28 NOTE — Telephone Encounter (Signed)
Spoke with patient and he is interested in being referred to a neurologist.  He will have to go to [facility] as he is uninsured.  Will work on referral. Wendall Mola CMA

## 2012-01-31 ENCOUNTER — Other Ambulatory Visit: Payer: Self-pay

## 2012-02-01 ENCOUNTER — Telehealth: Payer: Self-pay | Admitting: *Deleted

## 2012-02-01 NOTE — Telephone Encounter (Signed)
Called patient and notified him of appt with [facility] clinic for 03/30/12 at 1:45 pm. They informed me that a new patient packet will be sent with directions and appt info. Office note faxed to [phone], spoke with Florene Route. Wendall Mola CMA

## 2012-02-14 ENCOUNTER — Other Ambulatory Visit: Payer: Self-pay | Admitting: *Deleted

## 2012-02-14 ENCOUNTER — Ambulatory Visit: Payer: Self-pay | Admitting: Internal Medicine

## 2012-02-14 DIAGNOSIS — G44009 Cluster headache syndrome, unspecified, not intractable: Secondary | ICD-10-CM

## 2012-02-14 MED ORDER — TOPIRAMATE 25 MG PO TABS: 50.0000 mg | ORAL_TABLET | Freq: Two times a day (BID) | ORAL | Status: DC

## 2012-02-16 ENCOUNTER — Other Ambulatory Visit: Payer: Self-pay | Admitting: *Deleted

## 2012-02-16 ENCOUNTER — Telehealth: Payer: Self-pay | Admitting: *Deleted

## 2012-02-16 NOTE — Telephone Encounter (Signed)
Patient called regarding his Topamax Rx, it was not going through because he needs brand name as he receives through [place] [facility] [place].  Called and spoke with pharmacist at [facility] and it was resubmitted.  Patient aware Kenneth [name]

## 2012-05-10 ENCOUNTER — Ambulatory Visit: Payer: Self-pay | Admitting: Infectious Diseases

## 2012-05-10 ENCOUNTER — Telehealth: Payer: Self-pay | Admitting: *Deleted

## 2012-05-10 NOTE — Telephone Encounter (Signed)
Scheduled patient yesterday for an appt today with Dr. Ninetta Lights for a sick visit. He no showed, left voice mail that if he is not better to call and reschedule. Wendall Mola

## 2012-06-26 ENCOUNTER — Other Ambulatory Visit: Payer: Self-pay

## 2012-07-10 ENCOUNTER — Ambulatory Visit: Payer: Self-pay | Admitting: Infectious Diseases

## 2012-07-11 ENCOUNTER — Other Ambulatory Visit: Payer: Self-pay | Admitting: *Deleted

## 2012-07-11 DIAGNOSIS — G43809 Other migraine, not intractable, without status migrainosus: Secondary | ICD-10-CM

## 2012-07-11 MED ORDER — VERAPAMIL HCL ER 180 MG PO TBCR: 180.0000 mg | EXTENDED_RELEASE_TABLET | Freq: Every day | ORAL | Status: DC

## 2012-07-24 ENCOUNTER — Ambulatory Visit: Payer: Self-pay

## 2012-08-08 ENCOUNTER — Other Ambulatory Visit: Payer: Self-pay | Admitting: Licensed Clinical Social Worker

## 2012-08-08 DIAGNOSIS — G43809 Other migraine, not intractable, without status migrainosus: Secondary | ICD-10-CM

## 2012-08-08 MED ORDER — VERAPAMIL HCL ER 180 MG PO TBCR: 180.0000 mg | EXTENDED_RELEASE_TABLET | Freq: Every day | ORAL | Status: DC

## 2012-08-08 MED ORDER — TOPIRAMATE 25 MG PO TABS: 50.0000 mg | ORAL_TABLET | Freq: Two times a day (BID) | ORAL | Status: DC

## 2012-08-09 ENCOUNTER — Other Ambulatory Visit (INDEPENDENT_AMBULATORY_CARE_PROVIDER_SITE_OTHER): Payer: Self-pay

## 2012-08-09 ENCOUNTER — Other Ambulatory Visit: Payer: Self-pay | Admitting: Infectious Diseases

## 2012-08-09 DIAGNOSIS — B20 Human immunodeficiency virus [HIV] disease: Secondary | ICD-10-CM

## 2012-08-09 DIAGNOSIS — Z79899 Other long term (current) drug therapy: Secondary | ICD-10-CM

## 2012-08-09 DIAGNOSIS — Z113 Encounter for screening for infections with a predominantly sexual mode of transmission: Secondary | ICD-10-CM

## 2012-08-09 LAB — COMPREHENSIVE METABOLIC PANEL
Alkaline Phosphatase: 81 U/L (ref 39–117)
Glucose, Bld: 96 mg/dL (ref 70–99)
Sodium: 139 mEq/L (ref 135–145)
Total Bilirubin: 0.6 mg/dL (ref 0.3–1.2)
Total Protein: 7.4 g/dL (ref 6.0–8.3)

## 2012-08-09 LAB — LIPID PANEL
LDL Cholesterol: 93 mg/dL (ref 0–99)
Triglycerides: 88 mg/dL (ref <150)
VLDL: 18 mg/dL (ref 0–40)

## 2012-08-09 LAB — CBC
Hemoglobin: 13.3 g/dL (ref 13.0–17.0)
MCH: 29 pg (ref 26.0–34.0)
MCHC: 33.3 g/dL (ref 30.0–36.0)
Platelets: 277 10*3/uL (ref 150–400)

## 2012-08-10 LAB — HIV-1 RNA QUANT-NO REFLEX-BLD: HIV 1 RNA Quant: <20 copies/mL (ref <20)

## 2012-08-10 LAB — T-HELPER CELL (CD4) - (RCID CLINIC ONLY): CD4 % Helper T Cell: 39 % (ref 33–55)

## 2012-08-22 ENCOUNTER — Ambulatory Visit: Payer: Self-pay | Admitting: Infectious Diseases

## 2012-09-05 ENCOUNTER — Other Ambulatory Visit: Payer: Self-pay | Admitting: *Deleted

## 2012-09-05 DIAGNOSIS — B20 Human immunodeficiency virus [HIV] disease: Secondary | ICD-10-CM

## 2012-09-05 MED ORDER — EFAVIRENZ-EMTRICITAB-TENOFOVIR 600-200-300 MG PO TABS: 1.0000 | ORAL_TABLET | Freq: Every day | ORAL | Status: DC

## 2012-09-07 ENCOUNTER — Other Ambulatory Visit: Payer: Self-pay | Admitting: *Deleted

## 2012-09-07 DIAGNOSIS — G43809 Other migraine, not intractable, without status migrainosus: Secondary | ICD-10-CM

## 2012-09-07 MED ORDER — TOPIRAMATE 25 MG PO TABS: 50.0000 mg | ORAL_TABLET | Freq: Two times a day (BID) | ORAL | Status: DC

## 2012-09-07 MED ORDER — VERAPAMIL HCL ER 180 MG PO TBCR: 180.0000 mg | EXTENDED_RELEASE_TABLET | Freq: Every day | ORAL | Status: DC

## 2012-09-07 NOTE — Telephone Encounter (Signed)
Rx sent to [facility] in error had to call and D/C them and resend them to [employer].

## 2012-09-25 ENCOUNTER — Encounter: Payer: Self-pay | Admitting: Infectious Diseases

## 2012-09-25 ENCOUNTER — Ambulatory Visit (INDEPENDENT_AMBULATORY_CARE_PROVIDER_SITE_OTHER): Payer: Self-pay | Admitting: Infectious Diseases

## 2012-09-25 VITALS — BP 110/67 | HR 94 | Temp 98.1°F | Wt 157.0 lb

## 2012-09-25 DIAGNOSIS — B20 Human immunodeficiency virus [HIV] disease: Secondary | ICD-10-CM

## 2012-09-25 DIAGNOSIS — A630 Anogenital (venereal) warts: Secondary | ICD-10-CM

## 2012-09-25 DIAGNOSIS — G43809 Other migraine, not intractable, without status migrainosus: Secondary | ICD-10-CM

## 2012-09-25 MED ORDER — BUTALBITAL-APAP-CAFFEINE 50-325-40 MG PO TABS: 1.0000 | ORAL_TABLET | Freq: Two times a day (BID) | ORAL | Status: DC | PRN

## 2012-09-25 NOTE — Assessment & Plan Note (Signed)
He's doing well. Offered/refused condoms. vax are up to date. Will see him back in 6 months.

## 2012-09-25 NOTE — Progress Notes (Signed)
  Subjective:    Patient ID: Kenneth [name], male    DOB: [birth year], 32 y.o.   MRN: [id number]  Headache  Pertinent negatives include no fever.   32 yo M with hx of HIV+ (Dx 2006?), tension/cluster headaches.  Was recently seen in ID and continued on his topamax as well as started on verapamil and prednisone taper. No problems with atripla.  States that his headaches have been worse (notices them mostly as weather gets warmer). Has been taking [employer]ayer/BC powders. Has been messing his stomach up. Taking more and more. Has been taking verapamil, topamax.  Has not been able to make appt at [facility] due to needing co-pay. Also not able to make neuro appt for same reason.   HIV 1 RNA Quant (copies/mL)  Date Value  08/09/2012 <20   12/21/2011 <20   04/05/2011 <20      CD4 T Cell Abs (cmm)  Date Value  08/09/2012 870   12/21/2011 1240   04/05/2011 1030     Review of Systems  Constitutional: Negative for fever and chills.  Gastrointestinal: Negative for diarrhea and constipation.  Genitourinary: Negative for difficulty urinating.  Neurological: Positive for headaches.      Objective:   Physical Exam  Constitutional: He appears well-developed and well-nourished.  HENT:  Mouth/Throat: No oropharyngeal exudate.  Eyes: EOM are normal. Pupils are equal, round, and reactive to light.  Neck: Neck supple.  Cardiovascular: Normal rate, regular rhythm and normal heart sounds.   Pulmonary/Chest: Effort normal and breath sounds normal.  Abdominal: Soft. Bowel sounds are normal. There is no tenderness. There is no rebound.  Lymphadenopathy:    He has no cervical adenopathy.          Assessment & Plan:

## 2012-09-25 NOTE — Assessment & Plan Note (Signed)
Will cont topamax, verapamil, will add as needed fioricet.

## 2012-09-25 NOTE — Assessment & Plan Note (Signed)
Will put him on anoscopy list for our clinic.

## 2012-09-27 ENCOUNTER — Telehealth: Payer: Self-pay | Admitting: Licensed Clinical Social Worker

## 2012-09-27 NOTE — Telephone Encounter (Signed)
Patient called upset that his headache medication Fioricet will cost 25.00 a month on patient assistance and he can't afford it. Patient wanted Dr. Drue Second to see if she could write for something else that could be on patient assistance. The patient was seen by Dr. Ninetta Lights for this but he wanted me to send it to Dr. Drue Second.

## 2012-09-28 ENCOUNTER — Telehealth: Payer: Self-pay | Admitting: *Deleted

## 2012-09-28 NOTE — Telephone Encounter (Signed)
Faxed application to [employer] today

## 2012-09-28 NOTE — Telephone Encounter (Signed)
Patient called and asked what is happening with his Rx and PAP. Advised him that he will have to pay something for his Fioricet ($25per month) and that there is nothing else he can get. Advised him we do not control what the drug company requires and will or will not approve. The patient advised to just do it and send his Rx to the pharmacy and he will try to come up with the money somewhere.  Advised him that his Topamax application has been sent but that I am not sure where she is with his application for Fioricet and will have to ask her tomorrow and call him back because there may be something he needs to do on his own. He was ok with this and will wait for me to call him back.

## 2012-10-02 ENCOUNTER — Telehealth: Payer: Self-pay | Admitting: *Deleted

## 2012-10-02 NOTE — Telephone Encounter (Signed)
Kenneth [name] called back about his prescription for Fioricet.  Said no one told him he needed to fill out the application himself.  He also said he is taking 2 Verapamil a day.  I told him he I would transfer him back to [facility] so she can talk with the doctor about his doubling up on the Verapamil.

## 2012-10-02 NOTE — Telephone Encounter (Signed)
Received fax from [facility] requesting additional information.  Called and spoke with Kenneth [name].  They wanted to know if he has insurance.  He had checked the ADAP as applied.  I told them he has ADAP but [place] ADAP does not cover Topomax.  They will finish processing the application.

## 2012-10-02 NOTE — Telephone Encounter (Signed)
Patient is having trouble getting fioricet through patient assistance,  he tried the verapamil twice daily instead of just bedtime and it is working for him. Patient would like Dr. Ninetta Lights to change the dose of possible.

## 2012-10-03 ENCOUNTER — Telehealth: Payer: Self-pay | Admitting: Licensed Clinical Social Worker

## 2012-10-03 ENCOUNTER — Other Ambulatory Visit: Payer: Self-pay | Admitting: Licensed Clinical Social Worker

## 2012-10-03 NOTE — Telephone Encounter (Signed)
Error

## 2012-10-04 ENCOUNTER — Telehealth: Payer: Self-pay | Admitting: *Deleted

## 2012-10-04 NOTE — Telephone Encounter (Signed)
Called [place] and told him I have the co-pay card and prescription for his Fioricet.  It is up front with Vikki Ports

## 2012-10-04 NOTE — Telephone Encounter (Signed)
Received V/M from [name] saying the pharmacy said the co-pay card was not for the medication he was prescribed.  I called the pharmacy and was told for him to bring the card back when he came back to pick up his prescription.  I called and left him a v/m.

## 2012-10-09 ENCOUNTER — Telehealth: Payer: Self-pay | Admitting: *Deleted

## 2012-10-09 NOTE — Telephone Encounter (Signed)
Called to check status on [place]' application for his Topomax.  It has been approved until 10-03-13.  I called and let Rommie know he has been approved.

## 2012-10-16 ENCOUNTER — Other Ambulatory Visit: Payer: Self-pay | Admitting: Licensed Clinical Social Worker

## 2012-10-16 ENCOUNTER — Telehealth: Payer: Self-pay | Admitting: Licensed Clinical Social Worker

## 2012-10-16 DIAGNOSIS — G43809 Other migraine, not intractable, without status migrainosus: Secondary | ICD-10-CM

## 2012-10-16 MED ORDER — BUTALBITAL-APAP-CAFFEINE 50-325-40 MG PO TABS: 1.0000 | ORAL_TABLET | Freq: Two times a day (BID) | ORAL | Status: DC | PRN

## 2012-10-16 NOTE — Telephone Encounter (Signed)
Patient would like to know if he could get enough Fioricet to last for 30 days, currently he is getting 21 pills monthly. I explained to him that this medication is not meant to be taken everyday.

## 2012-10-16 NOTE — Telephone Encounter (Signed)
He should not be taking it that much

## 2012-10-31 ENCOUNTER — Other Ambulatory Visit: Payer: Self-pay | Admitting: Licensed Clinical Social Worker

## 2012-10-31 DIAGNOSIS — G43809 Other migraine, not intractable, without status migrainosus: Secondary | ICD-10-CM

## 2012-10-31 MED ORDER — TOPIRAMATE 25 MG PO TABS: 50.0000 mg | ORAL_TABLET | Freq: Two times a day (BID) | ORAL | Status: DC

## 2012-10-31 MED ORDER — VERAPAMIL HCL ER 180 MG PO TBCR: 180.0000 mg | EXTENDED_RELEASE_TABLET | Freq: Every day | ORAL | Status: DC

## 2012-10-31 MED ORDER — BUTALBITAL-APAP-CAFFEINE 50-325-40 MG PO TABS: 1.0000 | ORAL_TABLET | Freq: Two times a day (BID) | ORAL | Status: DC | PRN

## 2012-11-16 ENCOUNTER — Telehealth: Payer: Self-pay | Admitting: *Deleted

## 2012-11-16 NOTE — Telephone Encounter (Signed)
Patient called requesting his Rx for Fioricet, which is too early, not due until 12/01/12. He only gets 21 per month and a note was sent to Dr. Luciana Axe on 10/16/12 and he said patient was not suppose to be taking this daily, only as needed. He said he does need it daily and was very argumentative. Offered him a referral to [facility] neurology which Dr. Ninetta Lights had spoken about previously and he said the copay was $25.00 and he could not afford that. I told him I would have my supervisor Tomasita Morrow call him back as he was not happy with what I was trying to offer him.

## 2012-11-17 ENCOUNTER — Telehealth: Payer: Self-pay

## 2012-11-17 DIAGNOSIS — G43909 Migraine, unspecified, not intractable, without status migrainosus: Secondary | ICD-10-CM

## 2012-11-17 MED ORDER — BUTALBITAL-APAP-CAFFEINE 50-325-40 MG PO TABS: 1.0000 | ORAL_TABLET | ORAL | Status: DC | PRN

## 2012-11-17 NOTE — Telephone Encounter (Signed)
Pt calling regarding headache mediacation( Fioricet).  He does not understand why he only gets 21 tablets per month . He states he usually ends up calling for early refills.   He would like for me to discuss this with the provider.  Per Dr Ninetta Lights, medication can be changed to # 30 tablets.  Directions changed to Fioricet take one daily as needed for headache.  Pt will need referral to neurologist to evaluate headaches. Daily headaches are not normal and this should be evaluated for appropriate treatment.    Patient stated he does not have the funds for the specialist co-pay which is $ 20.00 since he is unemployed.  Dr Ninetta Lights stated the co pay would end up being the same as the amount he is paying for the medication.  The referral at this point is not optional.  Dr Ninetta Lights is a infectious disease specialist and a neurologist evaluation is what is needed for his continued headaches.    Laurell Josephs, RN   Above information left on [employer].  1 pm Medication sent to pharmacy.   Laurell Josephs, RN

## 2012-11-28 ENCOUNTER — Ambulatory Visit (INDEPENDENT_AMBULATORY_CARE_PROVIDER_SITE_OTHER): Payer: Self-pay | Admitting: Infectious Diseases

## 2012-11-28 VITALS — BP 125/75 | HR 79

## 2012-11-28 DIAGNOSIS — A630 Anogenital (venereal) warts: Secondary | ICD-10-CM

## 2012-11-28 NOTE — Progress Notes (Signed)
Patient ID: Kenneth [name], male   DOB: Sep 07, 1980, 32 y.o.   MRN: [id number] 32 yo M with hx HIV+ since 2006, on atripla.  HIV 1 RNA Quant (copies/mL)  Date Value  08/09/2012 <20   12/21/2011 <20   04/05/2011 <20      CD4 T Cell Abs (cmm)  Date Value  08/09/2012 870   12/21/2011 1240   04/05/2011 1030     Here for HRA. Hx of previous anal condyloma resected 02-2007.   PRE-OPERATIVE DIAGNOSIS:  Anal condyloma, HRA with bx  POST-OPERATIVE DIAGNOSIS:  Anal condyloma, HRA  PROCEDURE:  HRA  SURGEON:  Graciella Belton  ASSISTANTClaudine Mouton  ANESTHESIA:   local  EBL: < 10 cc   SPECIMEN:  No Specimen  DISPOSITION OF SPECIMEN:  N/A  COUNTS:  YES  PLAN OF CARE: home  PATIENT DISPOSITION:  home  INDICATION: anal condyloma  OR FINDINGS: no suspicious internal lesions  DESCRIPTION: The patient was identified in the waiting area and taken to the exam room where they were laid on the table in the lateral decubitus position.  The patient was then prepped and draped in the usual sterile fashion. A surgical timeout was performed indicating the correct patient, procedure, positioning. .   After this was completed, a sponge was soaked in 2% acetic acid was placed over the perianal region. This was allowed to soak for 1 minute. The sponge was removed and the perianal region was evaluated with a colposcope.  There were no suspicious external lesions.  The internal anal canal was evaluated via anoscopy with an anoscope.  There were no suspicious internal lesoins.

## 2013-01-11 ENCOUNTER — Other Ambulatory Visit: Payer: Self-pay | Admitting: *Deleted

## 2013-01-11 DIAGNOSIS — B20 Human immunodeficiency virus [HIV] disease: Secondary | ICD-10-CM

## 2013-01-11 MED ORDER — EFAVIRENZ-EMTRICITAB-TENOFOVIR 600-200-300 MG PO TABS: 1.0000 | ORAL_TABLET | Freq: Every day | ORAL | Status: DC

## 2013-01-24 ENCOUNTER — Ambulatory Visit: Payer: Self-pay

## 2013-02-04 ENCOUNTER — Other Ambulatory Visit: Payer: Self-pay | Admitting: Infectious Diseases

## 2013-02-20 ENCOUNTER — Ambulatory Visit (INDEPENDENT_AMBULATORY_CARE_PROVIDER_SITE_OTHER): Payer: Self-pay | Admitting: Licensed Clinical Social Worker

## 2013-02-20 DIAGNOSIS — Z23 Encounter for immunization: Secondary | ICD-10-CM

## 2013-03-05 ENCOUNTER — Other Ambulatory Visit (INDEPENDENT_AMBULATORY_CARE_PROVIDER_SITE_OTHER): Payer: Self-pay

## 2013-03-05 DIAGNOSIS — B20 Human immunodeficiency virus [HIV] disease: Secondary | ICD-10-CM

## 2013-03-05 LAB — COMPREHENSIVE METABOLIC PANEL
ALT: 14 U/L (ref 0–53)
Alkaline Phosphatase: 68 U/L (ref 39–117)
CO2: 27 mEq/L (ref 19–32)
Creat: 1.3 mg/dL (ref 0.50–1.35)
Sodium: 137 mEq/L (ref 135–145)
Total Bilirubin: 0.8 mg/dL (ref 0.3–1.2)
Total Protein: 7.6 g/dL (ref 6.0–8.3)

## 2013-03-05 LAB — CBC
MCH: 29.3 pg (ref 26.0–34.0)
MCHC: 34.4 g/dL (ref 30.0–36.0)
MCV: 85.1 fL (ref 78.0–100.0)
Platelets: 294 10*3/uL (ref 150–400)

## 2013-03-06 LAB — HIV-1 RNA QUANT-NO REFLEX-BLD: HIV 1 RNA Quant: <20 copies/mL (ref <20)

## 2013-03-07 LAB — T-HELPER CELL (CD4) - (RCID CLINIC ONLY)
CD4 % Helper T Cell: 41 % (ref 33–55)
CD4 T Cell Abs: 1070 /uL (ref 400–2700)

## 2013-03-19 ENCOUNTER — Ambulatory Visit: Payer: Self-pay | Admitting: Infectious Diseases

## 2013-03-20 ENCOUNTER — Ambulatory Visit: Payer: Self-pay | Admitting: Infectious Diseases

## 2013-04-12 ENCOUNTER — Other Ambulatory Visit: Payer: Self-pay | Admitting: Infectious Diseases

## 2013-05-02 ENCOUNTER — Other Ambulatory Visit: Payer: Self-pay | Admitting: *Deleted

## 2013-05-02 ENCOUNTER — Encounter: Payer: Self-pay | Admitting: Infectious Diseases

## 2013-05-02 ENCOUNTER — Ambulatory Visit (INDEPENDENT_AMBULATORY_CARE_PROVIDER_SITE_OTHER): Payer: Self-pay | Admitting: Infectious Diseases

## 2013-05-02 ENCOUNTER — Ambulatory Visit: Payer: Self-pay

## 2013-05-02 VITALS — BP 119/79 | HR 71 | Temp 98.0°F | Ht 72.0 in | Wt 167.0 lb

## 2013-05-02 DIAGNOSIS — Z79899 Other long term (current) drug therapy: Secondary | ICD-10-CM

## 2013-05-02 DIAGNOSIS — B20 Human immunodeficiency virus [HIV] disease: Secondary | ICD-10-CM

## 2013-05-02 DIAGNOSIS — Z113 Encounter for screening for infections with a predominantly sexual mode of transmission: Secondary | ICD-10-CM

## 2013-05-02 DIAGNOSIS — A630 Anogenital (venereal) warts: Secondary | ICD-10-CM

## 2013-05-02 DIAGNOSIS — G43809 Other migraine, not intractable, without status migrainosus: Secondary | ICD-10-CM

## 2013-05-02 MED ORDER — EFAVIRENZ-EMTRICITAB-TENOFOVIR 600-200-300 MG PO TABS: 1.0000 | ORAL_TABLET | Freq: Every day | ORAL | Status: DC

## 2013-05-02 NOTE — Assessment & Plan Note (Signed)
He's doing very well. Will continue his current meds. He is offered/refuses condoms. His vax are uptodate. Will see him back in 6 months.

## 2013-05-02 NOTE — Assessment & Plan Note (Signed)
Doing well. Continue to take as needed fioricet. He is taking his other rxs.

## 2013-05-02 NOTE — Assessment & Plan Note (Signed)
He is doing well- his HRA showed no abn lesions. Will continue to follow.

## 2013-05-02 NOTE — Progress Notes (Signed)
   Subjective:    Patient ID: Kenneth ConnersJames L Seaberry, male    DOB: 02-07-1981, 33 y.o.   MRN: [id number]  HPI 33 yo M with hx of HIV+ (Dx 2006?), tension/cluster headaches.  Was recently seen in ID and continued on his topamax as well as started on verapamil and prednisone taper.  H is doing well, no problems with his headaches currently. No problems with atripla.    HIV 1 RNA Quant (copies/mL)  Date Value  03/05/2013 <20   08/09/2012 <20   12/21/2011 <20      CD4 T Cell Abs (/uL)  Date Value  03/05/2013 1070   08/09/2012 870   12/21/2011 1240     Review of Systems     Objective:   Physical Exam  Constitutional: He appears well-developed and well-nourished.  HENT:  Mouth/Throat: No oropharyngeal exudate.  Eyes: EOM are normal. Pupils are equal, round, and reactive to light.  Neck: Neck supple.  Cardiovascular: Normal rate, regular rhythm and normal heart sounds.   Pulmonary/Chest: Effort normal and breath sounds normal.  Abdominal: Soft. Bowel sounds are normal. He exhibits no distension. There is no tenderness.  Lymphadenopathy:    He has no cervical adenopathy.          Assessment & Plan:

## 2013-05-11 ENCOUNTER — Other Ambulatory Visit: Payer: Self-pay | Admitting: Infectious Diseases

## 2013-05-22 ENCOUNTER — Telehealth: Payer: Self-pay | Admitting: *Deleted

## 2013-05-22 NOTE — Telephone Encounter (Signed)
Patient requested a PPD test for school.  RN advised him that as we already have one on file for our purposes, we are unable to do this test for him.  RN counseled patient that a PCP would be able to do this for him (we are his specialists) or he could have the test done at the [facility].  RN gave him the [facility] numbers [facility]. Andree CossHowell, Michelle M, RN

## 2013-06-09 ENCOUNTER — Other Ambulatory Visit: Payer: Self-pay | Admitting: Infectious Diseases

## 2013-06-11 ENCOUNTER — Other Ambulatory Visit: Payer: Self-pay | Admitting: *Deleted

## 2013-07-10 ENCOUNTER — Other Ambulatory Visit: Payer: Self-pay | Admitting: Infectious Diseases

## 2013-08-07 ENCOUNTER — Other Ambulatory Visit: Payer: Self-pay | Admitting: Infectious Diseases

## 2013-08-07 DIAGNOSIS — G43909 Migraine, unspecified, not intractable, without status migrainosus: Secondary | ICD-10-CM

## 2013-08-16 ENCOUNTER — Telehealth: Payer: Self-pay | Admitting: *Deleted

## 2013-08-16 DIAGNOSIS — G43909 Migraine, unspecified, not intractable, without status migrainosus: Secondary | ICD-10-CM

## 2013-08-16 MED ORDER — BUTALBITAL-APAP-CAFFEINE 50-325-40 MG PO TABS: 1.0000 | ORAL_TABLET | ORAL | Status: DC | PRN

## 2013-08-16 NOTE — Telephone Encounter (Signed)
Pt advised that he will need to come to office to pick up paper rx.  Pt verbalized understanding.  Will need to sign Medication Contract.  Place printed rx in Dr. Moshe CiproHatcher's box for signature.  Pt asked to be connected with P. Yetta BarreJones to discuss "discount" for rx at the pharmacy.  Connected and needed to leave message.

## 2013-09-06 ENCOUNTER — Other Ambulatory Visit: Payer: Self-pay | Admitting: Infectious Diseases

## 2013-09-06 ENCOUNTER — Telehealth: Payer: Self-pay | Admitting: *Deleted

## 2013-09-06 NOTE — Telephone Encounter (Signed)
Called and left a message for Kenneth [name] to call me back about re-enrolling for his Topamax.

## 2013-09-10 ENCOUNTER — Other Ambulatory Visit: Payer: Self-pay | Admitting: *Deleted

## 2013-09-10 DIAGNOSIS — G43909 Migraine, unspecified, not intractable, without status migrainosus: Secondary | ICD-10-CM

## 2013-09-10 MED ORDER — BUTALBITAL-APAP-CAFFEINE 50-325-40 MG PO TABS: 1.0000 | ORAL_TABLET | ORAL | Status: DC | PRN

## 2013-09-10 MED ORDER — VERAPAMIL HCL ER 180 MG PO TBCR: EXTENDED_RELEASE_TABLET | ORAL | Status: DC

## 2013-09-10 MED ORDER — TOPAMAX 25 MG PO TABS: ORAL_TABLET | ORAL | Status: DC

## 2013-09-11 ENCOUNTER — Other Ambulatory Visit: Payer: Self-pay | Admitting: *Deleted

## 2013-09-11 DIAGNOSIS — G43909 Migraine, unspecified, not intractable, without status migrainosus: Secondary | ICD-10-CM

## 2013-09-11 MED ORDER — TOPAMAX 25 MG PO TABS: ORAL_TABLET | ORAL | Status: DC

## 2013-09-11 NOTE — Progress Notes (Signed)
For PA program.

## 2013-09-12 ENCOUNTER — Telehealth: Payer: Self-pay | Admitting: *Deleted

## 2013-09-12 NOTE — Telephone Encounter (Signed)
Faxed application for Topamax today.

## 2013-10-29 ENCOUNTER — Encounter: Payer: Self-pay | Admitting: *Deleted

## 2013-10-29 ENCOUNTER — Other Ambulatory Visit (INDEPENDENT_AMBULATORY_CARE_PROVIDER_SITE_OTHER): Payer: Self-pay

## 2013-10-29 ENCOUNTER — Other Ambulatory Visit: Payer: Self-pay | Admitting: *Deleted

## 2013-10-29 DIAGNOSIS — B20 Human immunodeficiency virus [HIV] disease: Secondary | ICD-10-CM

## 2013-10-29 DIAGNOSIS — Z113 Encounter for screening for infections with a predominantly sexual mode of transmission: Secondary | ICD-10-CM

## 2013-10-29 DIAGNOSIS — Z79899 Other long term (current) drug therapy: Secondary | ICD-10-CM

## 2013-10-29 DIAGNOSIS — G43909 Migraine, unspecified, not intractable, without status migrainosus: Secondary | ICD-10-CM

## 2013-10-29 LAB — COMPLETE METABOLIC PANEL WITH GFR
ALBUMIN: 4.3 g/dL (ref 3.5–5.2)
ALT: 11 U/L (ref 0–53)
AST: 13 U/L (ref 0–37)
Alkaline Phosphatase: 60 U/L (ref 39–117)
BUN: 9 mg/dL (ref 6–23)
CALCIUM: 8.6 mg/dL (ref 8.4–10.5)
CHLORIDE: 110 meq/L (ref 96–112)
CO2: 25 meq/L (ref 19–32)
Creat: 1.12 mg/dL (ref 0.50–1.35)
GFR, Est Non African American: 85 mL/min
Glucose, Bld: 83 mg/dL (ref 70–99)
Potassium: 4 mEq/L (ref 3.5–5.3)
SODIUM: 143 meq/L (ref 135–145)
Total Bilirubin: 0.4 mg/dL (ref 0.2–1.2)
Total Protein: 6.6 g/dL (ref 6.0–8.3)

## 2013-10-29 LAB — CBC WITH DIFFERENTIAL/PLATELET
Basophils Absolute: 0 10*3/uL (ref 0.0–0.1)
Basophils Relative: 0 % (ref 0–1)
Eosinophils Absolute: 0.1 10*3/uL (ref 0.0–0.7)
Eosinophils Relative: 2 % (ref 0–5)
HCT: 36.8 % — ABNORMAL LOW (ref 39.0–52.0)
Hemoglobin: 12.5 g/dL — ABNORMAL LOW (ref 13.0–17.0)
LYMPHS ABS: 2.8 10*3/uL (ref 0.7–4.0)
LYMPHS PCT: 40 % (ref 12–46)
MCH: 28.4 pg (ref 26.0–34.0)
MCHC: 34 g/dL (ref 30.0–36.0)
MCV: 83.6 fL (ref 78.0–100.0)
Monocytes Absolute: 0.7 10*3/uL (ref 0.1–1.0)
Monocytes Relative: 10 % (ref 3–12)
NEUTROS ABS: 3.4 10*3/uL (ref 1.7–7.7)
Neutrophils Relative %: 48 % (ref 43–77)
Platelets: 268 10*3/uL (ref 150–400)
RBC: 4.4 MIL/uL (ref 4.22–5.81)
RDW: 14.8 % (ref 11.5–15.5)
WBC: 7.1 10*3/uL (ref 4.0–10.5)

## 2013-10-29 LAB — LIPID PANEL
CHOL/HDL RATIO: 3.8 ratio
Cholesterol: 154 mg/dL (ref 0–200)
HDL: 41 mg/dL (ref >39)
LDL CALC: 81 mg/dL (ref 0–99)
Triglycerides: 160 mg/dL — ABNORMAL HIGH (ref <150)
VLDL: 32 mg/dL (ref 0–40)

## 2013-10-29 MED ORDER — BUTALBITAL-APAP-CAFFEINE 50-325-40 MG PO TABS: 1.0000 | ORAL_TABLET | ORAL | Status: DC | PRN

## 2013-10-29 NOTE — Progress Notes (Signed)
Patient ID: Kenneth ConnersJames L [name], male   DOB: [birth year], 33 y.o.   MRN: [id number] Walk-in to clinic, requesting Fioricet rx.  Will need to print ant obtain MD signature prior to pt pick-up.

## 2013-10-30 LAB — T-HELPER CELL (CD4) - (RCID CLINIC ONLY)
CD4 % Helper T Cell: 45 % (ref 33–55)
CD4 T Cell Abs: 1380 /uL (ref 400–2700)

## 2013-10-30 LAB — RPR

## 2013-10-30 LAB — HEPATITIS B SURFACE ANTIBODY,QUALITATIVE: Hep B S Ab: POSITIVE — AB

## 2013-10-31 LAB — HIV-1 RNA QUANT-NO REFLEX-BLD: HIV 1 RNA Quant: <20 copies/mL (ref <20)

## 2013-11-12 ENCOUNTER — Ambulatory Visit (INDEPENDENT_AMBULATORY_CARE_PROVIDER_SITE_OTHER): Payer: Self-pay | Admitting: Infectious Diseases

## 2013-11-12 ENCOUNTER — Encounter: Payer: Self-pay | Admitting: Infectious Diseases

## 2013-11-12 VITALS — BP 123/76 | HR 82 | Temp 98.5°F | Ht 72.0 in | Wt 168.0 lb

## 2013-11-12 DIAGNOSIS — L739 Follicular disorder, unspecified: Secondary | ICD-10-CM | POA: Insufficient documentation

## 2013-11-12 DIAGNOSIS — L738 Other specified follicular disorders: Secondary | ICD-10-CM

## 2013-11-12 DIAGNOSIS — Z113 Encounter for screening for infections with a predominantly sexual mode of transmission: Secondary | ICD-10-CM

## 2013-11-12 DIAGNOSIS — B20 Human immunodeficiency virus [HIV] disease: Secondary | ICD-10-CM

## 2013-11-12 DIAGNOSIS — A630 Anogenital (venereal) warts: Secondary | ICD-10-CM

## 2013-11-12 DIAGNOSIS — L678 Other hair color and hair shaft abnormalities: Secondary | ICD-10-CM

## 2013-11-12 DIAGNOSIS — G43809 Other migraine, not intractable, without status migrainosus: Secondary | ICD-10-CM

## 2013-11-12 DIAGNOSIS — Z79899 Other long term (current) drug therapy: Secondary | ICD-10-CM

## 2013-11-12 NOTE — Progress Notes (Signed)
   Subjective:    Patient ID: Kenneth ConnersJames L [name], male    DOB: 01/26/1981, 33 y.o.   MRN: [id number]  HPI 33 yo M with hx of HIV+ (Dx 2006?), tension/cluster headaches. Currently taking atripla.  Currently has a boil on his L cheek for a couple of months. Can't get it to go away. Has tried to express, no d/c. No erythema or streaking, no f/c.  No anal lesions.  contines to take topamax for headaches. occas fioricet.   HIV 1 RNA Quant (copies/mL)  Date Value  10/29/2013 <20   03/05/2013 <20   08/09/2012 <20      CD4 T Cell Abs (/uL)  Date Value  10/29/2013 1380   03/05/2013 1070   08/09/2012 870     Review of Systems  Constitutional: Negative for appetite change and unexpected weight change.  Gastrointestinal: Negative for diarrhea and constipation.  Genitourinary: Negative for difficulty urinating.  Skin: Positive for wound.  Neurological: Negative for headaches.       Objective:   Physical Exam  Constitutional: He appears well-developed and well-nourished.  HENT:  Mouth/Throat: No oropharyngeal exudate.  Eyes: EOM are normal. Pupils are equal, round, and reactive to light.  Neck: Neck supple.  Cardiovascular: Normal rate, regular rhythm and normal heart sounds.   Pulmonary/Chest: Effort normal and breath sounds normal.  Abdominal: Soft. Bowel sounds are normal. He exhibits no distension. There is no tenderness.  Genitourinary:     Lymphadenopathy:    He has no cervical adenopathy.          Assessment & Plan:

## 2013-11-12 NOTE — Assessment & Plan Note (Signed)
He is doing well. Encouraged him to use fioricet cautiously due to potential addictive nature of narcotic.

## 2013-11-12 NOTE — Assessment & Plan Note (Signed)
He is doing very well. Will continue his current art. He is Hep B immune. Offered condoms. rtc 6 months.

## 2013-11-12 NOTE — Assessment & Plan Note (Signed)
Recommend that he use warm compresses. If does not resolve, will have him call back and will send him to surgery.

## 2013-11-12 NOTE — Assessment & Plan Note (Signed)
No current external lesions. Will continue to monitor.

## 2013-11-13 ENCOUNTER — Other Ambulatory Visit: Payer: Self-pay | Admitting: *Deleted

## 2013-11-13 DIAGNOSIS — B20 Human immunodeficiency virus [HIV] disease: Secondary | ICD-10-CM

## 2013-11-13 MED ORDER — EFAVIRENZ-EMTRICITAB-TENOFOVIR 600-200-300 MG PO TABS: 1.0000 | ORAL_TABLET | Freq: Every day | ORAL | Status: DC

## 2013-11-13 NOTE — Telephone Encounter (Signed)
ADAP Application 

## 2013-12-06 ENCOUNTER — Other Ambulatory Visit: Payer: Self-pay | Admitting: Infectious Diseases

## 2014-01-01 ENCOUNTER — Other Ambulatory Visit: Payer: Self-pay | Admitting: Infectious Diseases

## 2014-01-01 DIAGNOSIS — B20 Human immunodeficiency virus [HIV] disease: Secondary | ICD-10-CM

## 2014-01-01 DIAGNOSIS — G44009 Cluster headache syndrome, unspecified, not intractable: Secondary | ICD-10-CM

## 2014-03-04 ENCOUNTER — Telehealth: Payer: Self-pay | Admitting: *Deleted

## 2014-03-04 ENCOUNTER — Other Ambulatory Visit: Payer: Self-pay | Admitting: Infectious Diseases

## 2014-03-04 DIAGNOSIS — A630 Anogenital (venereal) warts: Secondary | ICD-10-CM

## 2014-03-04 MED ORDER — IMIQUIMOD 2.5 % EX CREA: 1.0000 "application " | TOPICAL_CREAM | CUTANEOUS | Status: DC

## 2014-03-04 NOTE — Telephone Encounter (Signed)
Patient called requesting refill on Imiquimod cream. [facility]. Wendall MolaJacqueline Cockerham

## 2014-03-27 ENCOUNTER — Other Ambulatory Visit: Payer: Self-pay | Admitting: Infectious Diseases

## 2014-03-28 ENCOUNTER — Other Ambulatory Visit: Payer: Self-pay | Admitting: *Deleted

## 2014-03-28 DIAGNOSIS — A630 Anogenital (venereal) warts: Secondary | ICD-10-CM

## 2014-03-28 DIAGNOSIS — G43009 Migraine without aura, not intractable, without status migrainosus: Secondary | ICD-10-CM

## 2014-03-28 MED ORDER — VERAPAMIL HCL ER 180 MG PO TBCR: 180.0000 mg | EXTENDED_RELEASE_TABLET | Freq: Every day | ORAL | Status: DC

## 2014-03-28 MED ORDER — TOPAMAX 25 MG PO TABS: 50.0000 mg | ORAL_TABLET | Freq: Two times a day (BID) | ORAL | Status: DC

## 2014-03-28 MED ORDER — IMIQUIMOD 2.5 % EX CREA: 1.0000 "application " | TOPICAL_CREAM | CUTANEOUS | Status: DC

## 2014-04-02 ENCOUNTER — Other Ambulatory Visit: Payer: Self-pay

## 2014-04-10 ENCOUNTER — Other Ambulatory Visit: Payer: Self-pay

## 2014-04-10 ENCOUNTER — Ambulatory Visit (INDEPENDENT_AMBULATORY_CARE_PROVIDER_SITE_OTHER): Payer: Self-pay | Admitting: Infectious Diseases

## 2014-04-10 ENCOUNTER — Encounter: Payer: Self-pay | Admitting: Infectious Diseases

## 2014-04-10 ENCOUNTER — Other Ambulatory Visit: Payer: Self-pay | Admitting: *Deleted

## 2014-04-10 VITALS — BP 121/74 | HR 66 | Temp 97.8°F | Wt 170.0 lb

## 2014-04-10 DIAGNOSIS — B20 Human immunodeficiency virus [HIV] disease: Secondary | ICD-10-CM

## 2014-04-10 DIAGNOSIS — G43809 Other migraine, not intractable, without status migrainosus: Secondary | ICD-10-CM

## 2014-04-10 DIAGNOSIS — M545 Low back pain, unspecified: Secondary | ICD-10-CM

## 2014-04-10 DIAGNOSIS — A630 Anogenital (venereal) warts: Secondary | ICD-10-CM

## 2014-04-10 DIAGNOSIS — G44009 Cluster headache syndrome, unspecified, not intractable: Secondary | ICD-10-CM

## 2014-04-10 DIAGNOSIS — M5489 Other dorsalgia: Secondary | ICD-10-CM

## 2014-04-10 LAB — BASIC METABOLIC PANEL
BUN: 13 mg/dL (ref 6–23)
CALCIUM: 9.4 mg/dL (ref 8.4–10.5)
CO2: 25 meq/L (ref 19–32)
Chloride: 109 mEq/L (ref 96–112)
Creat: 1 mg/dL (ref 0.50–1.35)
Glucose, Bld: 80 mg/dL (ref 70–99)
Potassium: 4.3 mEq/L (ref 3.5–5.3)
SODIUM: 140 meq/L (ref 135–145)

## 2014-04-10 MED ORDER — BUTALBITAL-APAP-CAFFEINE 50-325-40 MG PO TABS: ORAL_TABLET | ORAL | Status: DC

## 2014-04-10 MED ORDER — IBUPROFEN 400 MG PO TABS
400.0000 mg | ORAL_TABLET | Freq: Four times a day (QID) | ORAL | Status: DC | PRN
Start: 2014-04-10 — End: 2015-01-15

## 2014-04-10 NOTE — Assessment & Plan Note (Signed)
Will give him motrin rx.

## 2014-04-10 NOTE — Assessment & Plan Note (Signed)
Will reschedule for HRA 2016

## 2014-04-10 NOTE — Assessment & Plan Note (Signed)
Doing well. Offered/refused condoms. Has gotten flu shot.  Will recheck his labs today. Will see him back in 6 months unless lab abnormalities.

## 2014-04-10 NOTE — Assessment & Plan Note (Signed)
Will refill his fioricet.

## 2014-04-10 NOTE — Progress Notes (Signed)
   Subjective:    Patient ID: Maryann ConnersJames L Trampe, male    DOB: 1980/11/21, 33 y.o.   MRN: [id number]  HPI 33 yo M with hx of HIV+ (Dx 2006?), tension/cluster headaches. Currently taking atripla. Prev anoscopy that showed no internal lesions.   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 Worse with with prolonged standing. Has started using heating pad. No problems with atripla. Headaches have been better since weather is colder. Has had new bumps on anus and has been using imiqouid cream which has reduced these.   HIV 1 RNA QUANT (copies/mL)  Date Value  10/29/2013 <20  03/05/2013 <20  08/09/2012 <20   CD4 T CELL ABS  Date Value  10/29/2013 1380 /uL  03/05/2013 1070 /uL  08/09/2012 870 cmm    Review of Systems  Constitutional: Negative for appetite change and unexpected weight change.  Gastrointestinal: Negative for diarrhea and constipation.  Genitourinary: Negative for difficulty urinating.  Neurological: Negative for headaches.       Objective:   Physical Exam  Constitutional: He appears well-developed and well-nourished.  HENT:  Mouth/Throat: No oropharyngeal exudate.  Eyes: EOM are normal. Pupils are equal, round, and reactive to light.  Neck: Neck supple. No thyromegaly present.  Cardiovascular: Normal rate, regular rhythm and normal heart sounds.   Pulmonary/Chest: Effort normal and breath sounds normal.  Abdominal: Soft. Bowel sounds are normal. He exhibits no distension. There is no tenderness.  Lymphadenopathy:    He has no cervical adenopathy.          Assessment & Plan:

## 2014-04-11 LAB — URINALYSIS, ROUTINE W REFLEX MICROSCOPIC
Bilirubin Urine: NEGATIVE
Glucose, UA: NEGATIVE mg/dL
HGB URINE DIPSTICK: NEGATIVE
KETONES UR: NEGATIVE mg/dL
Leukocytes, UA: NEGATIVE
Nitrite: NEGATIVE
PROTEIN: NEGATIVE mg/dL
Specific Gravity, Urine: 1.021 (ref 1.005–1.030)
UROBILINOGEN UA: 0.2 mg/dL (ref 0.0–1.0)
pH: 7 (ref 5.0–8.0)

## 2014-04-11 LAB — HIV-1 RNA QUANT-NO REFLEX-BLD

## 2014-04-11 LAB — URINE CYTOLOGY ANCILLARY ONLY
CHLAMYDIA, DNA PROBE: NEGATIVE
NEISSERIA GONORRHEA: NEGATIVE

## 2014-04-11 LAB — T-HELPER CELL (CD4) - (RCID CLINIC ONLY)
CD4 % Helper T Cell: 50 % (ref 33–55)
CD4 T Cell Abs: 910 /uL (ref 400–2700)

## 2014-04-17 ENCOUNTER — Ambulatory Visit: Payer: Self-pay | Admitting: Infectious Diseases

## 2014-05-31 ENCOUNTER — Other Ambulatory Visit: Payer: Self-pay | Admitting: Infectious Diseases

## 2014-06-25 ENCOUNTER — Ambulatory Visit: Payer: Self-pay

## 2014-06-27 ENCOUNTER — Other Ambulatory Visit: Payer: Self-pay | Admitting: *Deleted

## 2014-06-27 DIAGNOSIS — B20 Human immunodeficiency virus [HIV] disease: Secondary | ICD-10-CM

## 2014-06-27 MED ORDER — EFAVIRENZ-EMTRICITAB-TENOFOVIR 600-200-300 MG PO TABS: 1.0000 | ORAL_TABLET | Freq: Every day | ORAL | Status: DC

## 2014-06-27 NOTE — Telephone Encounter (Signed)
ADAP Application 

## 2014-06-29 ENCOUNTER — Other Ambulatory Visit: Payer: Self-pay | Admitting: Infectious Diseases

## 2014-08-01 ENCOUNTER — Other Ambulatory Visit (INDEPENDENT_AMBULATORY_CARE_PROVIDER_SITE_OTHER): Payer: Self-pay

## 2014-08-01 ENCOUNTER — Ambulatory Visit (INDEPENDENT_AMBULATORY_CARE_PROVIDER_SITE_OTHER): Payer: Self-pay | Admitting: *Deleted

## 2014-08-01 DIAGNOSIS — Z79899 Other long term (current) drug therapy: Secondary | ICD-10-CM

## 2014-08-01 DIAGNOSIS — B20 Human immunodeficiency virus [HIV] disease: Secondary | ICD-10-CM

## 2014-08-01 DIAGNOSIS — Z113 Encounter for screening for infections with a predominantly sexual mode of transmission: Secondary | ICD-10-CM

## 2014-08-01 LAB — COMPREHENSIVE METABOLIC PANEL
ALBUMIN: 4.9 g/dL (ref 3.5–5.2)
ALT: 11 U/L (ref 0–53)
AST: 12 U/L (ref 0–37)
Alkaline Phosphatase: 94 U/L (ref 39–117)
BUN: 12 mg/dL (ref 6–23)
CALCIUM: 9.7 mg/dL (ref 8.4–10.5)
CHLORIDE: 101 meq/L (ref 96–112)
CO2: 29 meq/L (ref 19–32)
Creat: 0.98 mg/dL (ref 0.50–1.35)
Glucose, Bld: 68 mg/dL — ABNORMAL LOW (ref 70–99)
Potassium: 3.8 mEq/L (ref 3.5–5.3)
SODIUM: 141 meq/L (ref 135–145)
TOTAL PROTEIN: 7.9 g/dL (ref 6.0–8.3)
Total Bilirubin: 0.4 mg/dL (ref 0.2–1.2)

## 2014-08-01 LAB — LIPID PANEL
CHOLESTEROL: 138 mg/dL (ref 0–200)
HDL: 34 mg/dL — ABNORMAL LOW (ref >=40)
LDL CALC: 90 mg/dL (ref 0–99)
TRIGLYCERIDES: 68 mg/dL (ref <150)
Total CHOL/HDL Ratio: 4.1 Ratio
VLDL: 14 mg/dL (ref 0–40)

## 2014-08-01 LAB — CBC
HEMATOCRIT: 40.2 % (ref 39.0–52.0)
HEMOGLOBIN: 13.9 g/dL (ref 13.0–17.0)
MCH: 28.9 pg (ref 26.0–34.0)
MCHC: 34.6 g/dL (ref 30.0–36.0)
MCV: 83.6 fL (ref 78.0–100.0)
MPV: 9.5 fL (ref 8.6–12.4)
Platelets: 346 10*3/uL (ref 150–400)
RBC: 4.81 MIL/uL (ref 4.22–5.81)
RDW: 14 % (ref 11.5–15.5)
WBC: 8.5 10*3/uL (ref 4.0–10.5)

## 2014-08-01 NOTE — Progress Notes (Signed)
Pt's most recent Td was 03/10/2005.  Does not need Tdap until Nov. 2016.  Starting new employment at [employer] [facility] [employer].

## 2014-08-02 LAB — HIV-1 RNA QUANT-NO REFLEX-BLD
HIV 1 RNA Quant: <20 copies/mL (ref <20)
HIV-1 RNA Quant, Log: <1.3 {Log} (ref <1.30)

## 2014-08-02 LAB — T-HELPER CELL (CD4) - (RCID CLINIC ONLY)
CD4 T CELL ABS: 670 /uL (ref 400–2700)
CD4 T CELL HELPER: 43 % (ref 33–55)

## 2014-08-02 LAB — RPR

## 2014-08-13 ENCOUNTER — Ambulatory Visit: Payer: Self-pay | Admitting: Infectious Diseases

## 2014-09-17 ENCOUNTER — Other Ambulatory Visit: Payer: Self-pay | Admitting: Infectious Diseases

## 2014-09-17 DIAGNOSIS — G43009 Migraine without aura, not intractable, without status migrainosus: Secondary | ICD-10-CM

## 2014-11-08 ENCOUNTER — Other Ambulatory Visit: Payer: Self-pay | Admitting: *Deleted

## 2014-11-08 DIAGNOSIS — B20 Human immunodeficiency virus [HIV] disease: Secondary | ICD-10-CM

## 2014-11-08 MED ORDER — EFAVIRENZ-EMTRICITAB-TENOFOVIR 600-200-300 MG PO TABS: 1.0000 | ORAL_TABLET | Freq: Every day | ORAL | Status: DC

## 2014-11-18 ENCOUNTER — Other Ambulatory Visit: Payer: Self-pay | Admitting: Infectious Diseases

## 2014-11-20 ENCOUNTER — Other Ambulatory Visit: Payer: Self-pay | Admitting: *Deleted

## 2014-11-20 DIAGNOSIS — G43009 Migraine without aura, not intractable, without status migrainosus: Secondary | ICD-10-CM

## 2014-11-20 MED ORDER — TOPAMAX 25 MG PO TABS: 50.0000 mg | ORAL_TABLET | Freq: Two times a day (BID) | ORAL | Status: DC

## 2014-11-27 ENCOUNTER — Ambulatory Visit: Payer: Self-pay | Admitting: Infectious Diseases

## 2014-12-27 ENCOUNTER — Other Ambulatory Visit: Payer: Self-pay | Admitting: Infectious Diseases

## 2015-01-08 ENCOUNTER — Other Ambulatory Visit (INDEPENDENT_AMBULATORY_CARE_PROVIDER_SITE_OTHER): Payer: Self-pay

## 2015-01-08 DIAGNOSIS — B20 Human immunodeficiency virus [HIV] disease: Secondary | ICD-10-CM

## 2015-01-08 LAB — CBC WITH DIFFERENTIAL/PLATELET
BASOS PCT: 0 % (ref 0–1)
Basophils Absolute: 0 10*3/uL (ref 0.0–0.1)
EOS PCT: 2 % (ref 0–5)
Eosinophils Absolute: 0.2 10*3/uL (ref 0.0–0.7)
HEMATOCRIT: 38.1 % — AB (ref 39.0–52.0)
Hemoglobin: 12.6 g/dL — ABNORMAL LOW (ref 13.0–17.0)
Lymphocytes Relative: 21 % (ref 12–46)
Lymphs Abs: 1.9 10*3/uL (ref 0.7–4.0)
MCH: 29.1 pg (ref 26.0–34.0)
MCHC: 33.1 g/dL (ref 30.0–36.0)
MCV: 88 fL (ref 78.0–100.0)
MONO ABS: 0.8 10*3/uL (ref 0.1–1.0)
MPV: 9.8 fL (ref 8.6–12.4)
Monocytes Relative: 9 % (ref 3–12)
Neutro Abs: 6.2 10*3/uL (ref 1.7–7.7)
Neutrophils Relative %: 68 % (ref 43–77)
Platelets: 268 10*3/uL (ref 150–400)
RBC: 4.33 MIL/uL (ref 4.22–5.81)
RDW: 14 % (ref 11.5–15.5)
WBC: 9.1 10*3/uL (ref 4.0–10.5)

## 2015-01-08 LAB — COMPLETE METABOLIC PANEL WITH GFR
ALT: 15 U/L (ref 9–46)
AST: 13 U/L (ref 10–40)
Albumin: 4.1 g/dL (ref 3.6–5.1)
Alkaline Phosphatase: 61 U/L (ref 40–115)
BUN: 20 mg/dL (ref 7–25)
CALCIUM: 9.1 mg/dL (ref 8.6–10.3)
CHLORIDE: 109 mmol/L (ref 98–110)
CO2: 23 mmol/L (ref 20–31)
Creat: 1.04 mg/dL (ref 0.60–1.35)
GFR, Est African American: >89 mL/min (ref >=60)
GFR, Est Non African American: >89 mL/min (ref >=60)
Glucose, Bld: 101 mg/dL — ABNORMAL HIGH (ref 65–99)
POTASSIUM: 4.4 mmol/L (ref 3.5–5.3)
SODIUM: 144 mmol/L (ref 135–146)
Total Bilirubin: 0.2 mg/dL (ref 0.2–1.2)
Total Protein: 6.8 g/dL (ref 6.1–8.1)

## 2015-01-09 LAB — HIV-1 RNA QUANT-NO REFLEX-BLD: HIV 1 RNA Quant: <20 copies/mL (ref <20)

## 2015-01-13 LAB — T-HELPER CELL (CD4) - (RCID CLINIC ONLY)
CD4 % Helper T Cell: 47 % (ref 33–55)
CD4 T CELL ABS: 930 /uL (ref 400–2700)

## 2015-01-15 ENCOUNTER — Encounter: Payer: Self-pay | Admitting: Infectious Diseases

## 2015-01-15 ENCOUNTER — Ambulatory Visit (INDEPENDENT_AMBULATORY_CARE_PROVIDER_SITE_OTHER): Payer: Self-pay | Admitting: Infectious Diseases

## 2015-01-15 VITALS — BP 115/74 | HR 80 | Temp 98.1°F | Ht 72.0 in | Wt 181.0 lb

## 2015-01-15 DIAGNOSIS — B20 Human immunodeficiency virus [HIV] disease: Secondary | ICD-10-CM

## 2015-01-15 DIAGNOSIS — Z79899 Other long term (current) drug therapy: Secondary | ICD-10-CM

## 2015-01-15 DIAGNOSIS — Z23 Encounter for immunization: Secondary | ICD-10-CM

## 2015-01-15 DIAGNOSIS — G43809 Other migraine, not intractable, without status migrainosus: Secondary | ICD-10-CM

## 2015-01-15 DIAGNOSIS — Z113 Encounter for screening for infections with a predominantly sexual mode of transmission: Secondary | ICD-10-CM

## 2015-01-15 DIAGNOSIS — A630 Anogenital (venereal) warts: Secondary | ICD-10-CM

## 2015-01-15 DIAGNOSIS — R319 Hematuria, unspecified: Secondary | ICD-10-CM

## 2015-01-15 NOTE — Assessment & Plan Note (Signed)
His exam is normal Was single episooe Will check gc/chlamydia Consider uro eval if UA shows RBC

## 2015-01-15 NOTE — Assessment & Plan Note (Signed)
Offered HRA for October, he defers to 2017

## 2015-01-15 NOTE — Progress Notes (Signed)
   Subjective:    Patient ID: Kenneth [name], male    DOB: 1981/04/21, 34 y.o.   MRN: [id number]  HPI 34 yo M with hx of HIV+ (Dx 2006?), tension/cluster headaches. Currently taking atripla. Prev anoscopy that showed no internal lesions.   Had 1 episode of hematuria last week.  Has been doing testicular exams since, no masses. Noted no pain.  No new partners.   HIV 1 RNA QUANT (copies/mL)  Date Value  01/08/2015 <20  08/01/2014 <20  04/10/2014 <20   CD4 T CELL ABS (/uL)  Date Value  01/08/2015 930  08/01/2014 670  04/10/2014 910    Headaches had been fine. Has been seen by neuro- imitrex nasal, verapamil dose increased.   Had URI 1 weeks ago.   Has also noted white bumps in his nose. constantly wiping his nose.   Review of Systems  Constitutional: Negative for fever, chills, appetite change and unexpected weight change.  Gastrointestinal: Negative for diarrhea and constipation.  Genitourinary: Positive for hematuria. Negative for dysuria and difficulty urinating.       Objective:   Physical Exam  Constitutional: He appears well-developed and well-nourished.  HENT:  Mouth/Throat: No oropharyngeal exudate.  Eyes: EOM are normal. Pupils are equal, round, and reactive to light.  Neck: Neck supple.  Cardiovascular: Normal rate, regular rhythm and normal heart sounds.   Pulmonary/Chest: Effort normal and breath sounds normal.  Abdominal: Soft. Bowel sounds are normal. There is no tenderness. There is no rebound.  Genitourinary: Penis normal. No penile tenderness.  Lymphadenopathy:    He has no cervical adenopathy.       Assessment & Plan:

## 2015-01-15 NOTE — Assessment & Plan Note (Signed)
He is doing well Will continue his current meds., consider change to odefsy next visit.  Offered/refused condoms.  rtc  6 months.  Flu shot today

## 2015-01-15 NOTE — Assessment & Plan Note (Signed)
Improved No more fioricet My great appreciation to neuro meds updated.

## 2015-01-16 LAB — URINE CYTOLOGY ANCILLARY ONLY
CHLAMYDIA, DNA PROBE: NEGATIVE
NEISSERIA GONORRHEA: NEGATIVE

## 2015-01-20 ENCOUNTER — Telehealth: Payer: Self-pay | Admitting: *Deleted

## 2015-01-20 NOTE — Telephone Encounter (Signed)
Patient called for the results of his urine test. Informed him that the [facility] and Chlamydia test were both negative; but does not appear that the urinalysis was run. Asked Mariea Clonts in the lab and she said it would have already resulted if run. She is checking into it. Offered patient to come in and leave another urine and he can not as he lives in [address]. Also suggested that an order could be sent to a local lab and he said he is not currently having any symptoms. He had previously seen blood in his urine. Asked if he was interested in a Urology referral and he said he would think about it and let us know.

## 2015-01-23 NOTE — Progress Notes (Signed)
Notified [facility] via fax.  Howell, Michelle M, RN  

## 2015-01-27 ENCOUNTER — Other Ambulatory Visit: Payer: Self-pay | Admitting: Infectious Diseases

## 2015-02-24 ENCOUNTER — Other Ambulatory Visit: Payer: Self-pay | Admitting: Infectious Diseases

## 2015-02-24 DIAGNOSIS — B20 Human immunodeficiency virus [HIV] disease: Secondary | ICD-10-CM

## 2015-02-25 ENCOUNTER — Other Ambulatory Visit: Payer: Self-pay | Admitting: *Deleted

## 2015-02-25 DIAGNOSIS — G43009 Migraine without aura, not intractable, without status migrainosus: Secondary | ICD-10-CM

## 2015-02-25 DIAGNOSIS — B20 Human immunodeficiency virus [HIV] disease: Secondary | ICD-10-CM

## 2015-02-25 MED ORDER — EFAVIRENZ-EMTRICITAB-TENOFOVIR 600-200-300 MG PO TABS: 1.0000 | ORAL_TABLET | Freq: Every day | ORAL | Status: DC

## 2015-02-25 MED ORDER — TOPAMAX 25 MG PO TABS: 50.0000 mg | ORAL_TABLET | Freq: Two times a day (BID) | ORAL | Status: DC

## 2015-03-31 ENCOUNTER — Other Ambulatory Visit: Payer: Self-pay | Admitting: Infectious Diseases

## 2015-04-30 ENCOUNTER — Other Ambulatory Visit: Payer: Self-pay | Admitting: Infectious Diseases

## 2015-04-30 DIAGNOSIS — I10 Essential (primary) hypertension: Secondary | ICD-10-CM

## 2015-06-19 ENCOUNTER — Ambulatory Visit: Payer: Self-pay

## 2015-07-09 ENCOUNTER — Other Ambulatory Visit (INDEPENDENT_AMBULATORY_CARE_PROVIDER_SITE_OTHER): Payer: Self-pay

## 2015-07-09 DIAGNOSIS — Z79899 Other long term (current) drug therapy: Secondary | ICD-10-CM

## 2015-07-09 DIAGNOSIS — B20 Human immunodeficiency virus [HIV] disease: Secondary | ICD-10-CM

## 2015-07-09 DIAGNOSIS — R319 Hematuria, unspecified: Secondary | ICD-10-CM

## 2015-07-09 DIAGNOSIS — Z113 Encounter for screening for infections with a predominantly sexual mode of transmission: Secondary | ICD-10-CM

## 2015-07-09 LAB — CBC
HCT: 41.9 % (ref 39.0–52.0)
HEMOGLOBIN: 13.8 g/dL (ref 13.0–17.0)
MCH: 28.8 pg (ref 26.0–34.0)
MCHC: 32.9 g/dL (ref 30.0–36.0)
MCV: 87.5 fL (ref 78.0–100.0)
MPV: 10 fL (ref 8.6–12.4)
Platelets: 294 10*3/uL (ref 150–400)
RBC: 4.79 MIL/uL (ref 4.22–5.81)
RDW: 14.6 % (ref 11.5–15.5)
WBC: 6.4 10*3/uL (ref 4.0–10.5)

## 2015-07-09 LAB — LIPID PANEL
CHOLESTEROL: 145 mg/dL (ref 125–200)
HDL: 58 mg/dL (ref >=40)
LDL Cholesterol: 75 mg/dL (ref <130)
TRIGLYCERIDES: 60 mg/dL (ref <150)
Total CHOL/HDL Ratio: 2.5 Ratio (ref <=5.0)
VLDL: 12 mg/dL (ref <30)

## 2015-07-09 LAB — COMPREHENSIVE METABOLIC PANEL
ALBUMIN: 4.5 g/dL (ref 3.6–5.1)
ALT: 18 U/L (ref 9–46)
AST: 18 U/L (ref 10–40)
Alkaline Phosphatase: 68 U/L (ref 40–115)
BILIRUBIN TOTAL: 0.5 mg/dL (ref 0.2–1.2)
BUN: 15 mg/dL (ref 7–25)
CO2: 26 mmol/L (ref 20–31)
CREATININE: 1.06 mg/dL (ref 0.60–1.35)
Calcium: 9.4 mg/dL (ref 8.6–10.3)
Chloride: 106 mmol/L (ref 98–110)
Glucose, Bld: 81 mg/dL (ref 65–99)
Potassium: 4.1 mmol/L (ref 3.5–5.3)
SODIUM: 142 mmol/L (ref 135–146)
TOTAL PROTEIN: 7.8 g/dL (ref 6.1–8.1)

## 2015-07-09 NOTE — Addendum Note (Signed)
Addended by: Mariea ClontsGREEN, Jamauri Kruzel D on: 07/09/2015 12:30 PM   Modules accepted: Orders

## 2015-07-10 LAB — URINE CYTOLOGY ANCILLARY ONLY
CHLAMYDIA, DNA PROBE: NEGATIVE
NEISSERIA GONORRHEA: NEGATIVE

## 2015-07-10 LAB — HIV-1 RNA QUANT-NO REFLEX-BLD
HIV 1 RNA Quant: <20 copies/mL (ref <20)
HIV-1 RNA Quant, Log: <1.3 Log copies/mL (ref <1.30)

## 2015-07-10 LAB — T-HELPER CELL (CD4) - (RCID CLINIC ONLY)
CD4 T CELL HELPER: 45 % (ref 33–55)
CD4 T Cell Abs: 770 /uL (ref 400–2700)

## 2015-07-10 LAB — RPR

## 2015-07-23 ENCOUNTER — Telehealth: Payer: Self-pay | Admitting: Infectious Diseases

## 2015-07-23 MED ORDER — SUMATRIPTAN 5 MG/ACT NA SOLN: 1.0000 | NASAL | Status: DC | PRN

## 2015-07-23 NOTE — Telephone Encounter (Signed)
Called pt to discuss his labs and his visit cancellation (family issue on providers part).  He would like his nasal imitrex refilled.  We discussed his labs.   HIV 1 RNA QUANT (copies/mL)  Date Value  07/09/2015 <20  01/08/2015 <20  08/01/2014 <20   CD4 T CELL ABS (/uL)  Date Value  07/09/2015 770  01/08/2015 930  08/01/2014 670    Will ask nursing staff to help him get med assistance for the imitrex spray Will refill apologized for the inconvenience of having to change his appt.

## 2015-07-24 NOTE — Telephone Encounter (Signed)
[name], can you help with Imitrex patient assistance? Thanks! Andree CossHowell, Vaishnavi Dalby M, RN

## 2015-07-31 ENCOUNTER — Ambulatory Visit: Payer: Self-pay | Admitting: Infectious Diseases

## 2015-08-19 ENCOUNTER — Other Ambulatory Visit: Payer: Self-pay | Admitting: *Deleted

## 2015-08-19 MED ORDER — SUMATRIPTAN 5 MG/ACT NA SOLN: 1.0000 | NASAL | Status: DC | PRN

## 2015-08-22 ENCOUNTER — Telehealth: Payer: Self-pay | Admitting: *Deleted

## 2015-08-22 ENCOUNTER — Other Ambulatory Visit: Payer: Self-pay | Admitting: Infectious Diseases

## 2015-08-22 DIAGNOSIS — G43809 Other migraine, not intractable, without status migrainosus: Secondary | ICD-10-CM

## 2015-08-22 NOTE — Telephone Encounter (Signed)
Patient calling for update on Imitrex spray patient assistance application.  He states he mailed the signed application back to [facility] around 4/14. Please contact patient at [phone].       Kenneth [name], Kenneth Fowle M, RN

## 2015-08-26 ENCOUNTER — Telehealth: Payer: Self-pay | Admitting: *Deleted

## 2015-08-26 NOTE — Telephone Encounter (Signed)
One bottle if Imitrex has only 6 doses, will need 4 bottles/month.  Increased number of bottles to 4/month with the pharmacy.  Pt uses "[employer]" Patient Assistance Program to obtain medication.  [facility] given 11 refills.  Program has approved pt for 1 year.

## 2015-09-08 ENCOUNTER — Telehealth: Payer: Self-pay | Admitting: *Deleted

## 2015-09-08 DIAGNOSIS — G43809 Other migraine, not intractable, without status migrainosus: Secondary | ICD-10-CM

## 2015-09-08 MED ORDER — SUMATRIPTAN 5 MG/ACT NA SOLN
NASAL | Status: DC
Start: 2015-09-08 — End: 2016-07-08

## 2015-09-08 NOTE — Telephone Encounter (Addendum)
Medication delivered to [facility] 5/15 by [facility].  Per [facility]Laurie [facility], they will coordinate the delivery to the patient from now on.   Verbal order given to Jacki ConesLaurie, [facility] [place] [phone]. Patient will come by 5/16 to pick up medication.

## 2015-09-08 NOTE — Addendum Note (Signed)
Addended by: Andree CossHOWELL, Shevaun Lovan M on: 09/08/2015 04:46 PM   Modules accepted: Orders

## 2015-09-08 NOTE — Telephone Encounter (Signed)
Patient calling to check on status of Imitrex nasal spray via patient assistance, asking to speak with Erin.  Erin unavailable this afternoon. Per chart review, verbal order for imitrex given by Jennet Maduroenise Estridge, [facility] to [facility] [place]. RN gave patient pharmacy's phone number to check on status, advised him to call back here if there were any issues.  Will 'cc Erin for follow up if necessary. Andree CossHowell, Vail Vuncannon M, RN

## 2015-09-24 ENCOUNTER — Other Ambulatory Visit: Payer: Self-pay | Admitting: Infectious Diseases

## 2015-09-24 DIAGNOSIS — B20 Human immunodeficiency virus [HIV] disease: Secondary | ICD-10-CM

## 2015-09-26 ENCOUNTER — Telehealth: Payer: Self-pay

## 2015-09-26 NOTE — Telephone Encounter (Signed)
I will let Dr. Ninetta LightsHatcher handle this next week

## 2015-09-26 NOTE — Telephone Encounter (Signed)
Received phone call from Candise BowensJen at [facility] wanting to clarify prescription orders for patient's Imitrex inhalers. Read back prescription written to dispense 4 inhalers with 11 refills. Will send to Dr. Ninetta LightsHatcher and RN to verify prescription. Rejeana Brockandace Micahel Omlor, LPN

## 2015-09-29 NOTE — Telephone Encounter (Addendum)
Pt having "daily" Migraine Variant headaches, "nasal spray not working well."  Eager to discuss possible treatments at upcoming office visit with Dr. Ninetta LightsHatcher, September 30, 1098.  Headaches are the worst they have ever been.  May not need the Imitrex nasal spray since it is not working for the pt.

## 2015-09-29 NOTE — Telephone Encounter (Signed)
Let me know if this has not been resolved thanks

## 2015-09-29 NOTE — Telephone Encounter (Signed)
Will route to Jennet Maduroenise Estridge, Charity fundraiserN.

## 2015-10-01 ENCOUNTER — Ambulatory Visit (INDEPENDENT_AMBULATORY_CARE_PROVIDER_SITE_OTHER): Payer: Self-pay | Admitting: Infectious Diseases

## 2015-10-01 ENCOUNTER — Encounter: Payer: Self-pay | Admitting: Infectious Diseases

## 2015-10-01 VITALS — BP 126/83 | HR 74 | Temp 98.2°F | Wt 170.0 lb

## 2015-10-01 DIAGNOSIS — B20 Human immunodeficiency virus [HIV] disease: Secondary | ICD-10-CM

## 2015-10-01 DIAGNOSIS — Z79899 Other long term (current) drug therapy: Secondary | ICD-10-CM

## 2015-10-01 DIAGNOSIS — G43809 Other migraine, not intractable, without status migrainosus: Secondary | ICD-10-CM

## 2015-10-01 DIAGNOSIS — Z113 Encounter for screening for infections with a predominantly sexual mode of transmission: Secondary | ICD-10-CM

## 2015-10-01 LAB — LIPID PANEL
CHOLESTEROL: 149 mg/dL (ref 125–200)
HDL: 46 mg/dL (ref >=40)
LDL CALC: 86 mg/dL (ref <130)
Total CHOL/HDL Ratio: 3.2 Ratio (ref <=5.0)
Triglycerides: 83 mg/dL (ref <150)
VLDL: 17 mg/dL (ref <30)

## 2015-10-01 LAB — CBC
HCT: 39.9 % (ref 38.5–50.0)
Hemoglobin: 13.2 g/dL (ref 13.2–17.1)
MCH: 28.7 pg (ref 27.0–33.0)
MCHC: 33.1 g/dL (ref 32.0–36.0)
MCV: 86.7 fL (ref 80.0–100.0)
MPV: 10 fL (ref 7.5–12.5)
PLATELETS: 307 10*3/uL (ref 140–400)
RBC: 4.6 MIL/uL (ref 4.20–5.80)
RDW: 14.7 % (ref 11.0–15.0)
WBC: 8 10*3/uL (ref 3.8–10.8)

## 2015-10-01 LAB — COMPREHENSIVE METABOLIC PANEL
ALT: 12 U/L (ref 9–46)
AST: 12 U/L (ref 10–40)
Albumin: 4.6 g/dL (ref 3.6–5.1)
Alkaline Phosphatase: 70 U/L (ref 40–115)
BUN: 12 mg/dL (ref 7–25)
CHLORIDE: 107 mmol/L (ref 98–110)
CO2: 23 mmol/L (ref 20–31)
CREATININE: 1.15 mg/dL (ref 0.60–1.35)
Calcium: 9.4 mg/dL (ref 8.6–10.3)
GLUCOSE: 82 mg/dL (ref 65–99)
Potassium: 4 mmol/L (ref 3.5–5.3)
SODIUM: 141 mmol/L (ref 135–146)
TOTAL PROTEIN: 7.4 g/dL (ref 6.1–8.1)
Total Bilirubin: 0.5 mg/dL (ref 0.2–1.2)

## 2015-10-01 MED ORDER — PREDNISONE 20 MG PO TABS: 20.0000 mg | ORAL_TABLET | Freq: Every day | ORAL | Status: DC

## 2015-10-01 NOTE — Addendum Note (Signed)
Addended byJimmy Picket: ABBITT, KATRINA F on: 10/01/2015 12:15 PM   Modules accepted: Orders

## 2015-10-01 NOTE — Assessment & Plan Note (Signed)
Offered/refused condoms.  Offered to change him to [facility] but he does not want to switch.  Will see him back in 6 months.

## 2015-10-01 NOTE — Assessment & Plan Note (Signed)
Will give him steroid taper as he had previously.  Will get him back in with neuro as his current regimen is not working.

## 2015-10-01 NOTE — Progress Notes (Signed)
   Subjective:    Patient ID: Maryann ConnersJames L Galyean, male    DOB: May 23, 1980, 35 y.o.   MRN: [id number]  HPI 35 yo M with hx of HIV+ (Dx 2006?), tension/cluster headaches.Was prev seen by neuro and given intranasal imitrex, topomax and verapamil po.  Has not had neuro f/u for > 1 year.  Have recurred this spring, feels like they are worse than usual. Has only gotten relief from O2. Which he has at home.  Currently taking atripla. Prev anoscopy that showed no internal lesions.   HIV 1 RNA QUANT (copies/mL)  Date Value  07/09/2015 <20  01/08/2015 <20  08/01/2014 <20   CD4 T CELL ABS (/uL)  Date Value  07/09/2015 770  01/08/2015 930  08/01/2014 670     Review of Systems  Constitutional: Negative for appetite change and unexpected weight change.  Eyes: Negative for visual disturbance.  Gastrointestinal: Positive for nausea and vomiting. Negative for diarrhea and constipation.  Genitourinary: Negative for difficulty urinating.  Neurological: Positive for headaches.       Objective:   Physical Exam  Constitutional: He appears well-developed and well-nourished.  HENT:  Mouth/Throat: No oropharyngeal exudate.  Eyes: EOM are normal. Pupils are equal, round, and reactive to light.  Neck: Neck supple.  Cardiovascular: Normal rate, regular rhythm and normal heart sounds.   Pulmonary/Chest: Effort normal and breath sounds normal.  Abdominal: Soft. Bowel sounds are normal. There is no tenderness. There is no rebound.  Musculoskeletal: He exhibits no edema.  Lymphadenopathy:    He has no cervical adenopathy.       Assessment & Plan:

## 2015-10-02 LAB — HIV-1 RNA QUANT-NO REFLEX-BLD

## 2015-10-02 LAB — T-HELPER CELL (CD4) - (RCID CLINIC ONLY)
CD4 T CELL ABS: 790 /uL (ref 400–2700)
CD4 T CELL HELPER: 46 % (ref 33–55)

## 2015-10-02 LAB — URINE CYTOLOGY ANCILLARY ONLY
Chlamydia: NEGATIVE
NEISSERIA GONORRHEA: NEGATIVE

## 2015-10-02 LAB — RPR

## 2015-10-23 ENCOUNTER — Other Ambulatory Visit: Payer: Self-pay | Admitting: Infectious Diseases

## 2015-10-29 ENCOUNTER — Telehealth: Payer: Self-pay | Admitting: Infectious Diseases

## 2015-10-29 NOTE — Telephone Encounter (Signed)
Left pt vm to return call, [facility] to renew application for patient to continue receiving assistance for topamax. Will need patient to bring 2016 tax return and sign application.

## 2015-11-17 ENCOUNTER — Other Ambulatory Visit: Payer: Self-pay | Admitting: Infectious Diseases

## 2015-11-17 DIAGNOSIS — B20 Human immunodeficiency virus [HIV] disease: Secondary | ICD-10-CM

## 2015-11-18 ENCOUNTER — Telehealth: Payer: Self-pay

## 2015-11-18 NOTE — Telephone Encounter (Signed)
Call from [facility], patient has been scheduled with Dr. Assunta Found for 01/22/16 at 10:00. Patient notified

## 2015-11-18 NOTE — Telephone Encounter (Signed)
Neurology referral faxed to [facility]. Fax returned OK status. Rejeana Brock, LPN

## 2015-11-19 ENCOUNTER — Other Ambulatory Visit: Payer: Self-pay | Admitting: *Deleted

## 2015-11-19 DIAGNOSIS — G43009 Migraine without aura, not intractable, without status migrainosus: Secondary | ICD-10-CM

## 2015-11-19 MED ORDER — TOPAMAX 25 MG PO TABS: 50.0000 mg | ORAL_TABLET | Freq: Two times a day (BID) | ORAL | 11 refills | Status: DC

## 2016-01-22 ENCOUNTER — Encounter: Payer: Self-pay | Admitting: Neurology

## 2016-01-22 ENCOUNTER — Ambulatory Visit (INDEPENDENT_AMBULATORY_CARE_PROVIDER_SITE_OTHER): Payer: Self-pay | Admitting: Neurology

## 2016-01-22 VITALS — BP 106/68 | HR 74 | Ht 72.0 in | Wt 177.0 lb

## 2016-01-22 DIAGNOSIS — G44019 Episodic cluster headache, not intractable: Secondary | ICD-10-CM

## 2016-01-22 MED ORDER — TOPIRAMATE 50 MG PO TABS: ORAL_TABLET | ORAL | 0 refills | Status: DC

## 2016-01-22 NOTE — Patient Instructions (Addendum)
1.  I will increase your topiramate. I will prescribe 50mg  tablets.  Take 1 tablet in AM and 2 tablets at night for 7 days, then 2 tablets twice daily.  Continue verapamil 180mg  daily and melatonin 200mg  at bedtime. verapamil).  If okay, then I plan on increasing the verapamil.  Otherwise, I plan to increase the topiramate. 2.  Follow up in 3 months.

## 2016-01-22 NOTE — Progress Notes (Signed)
NEUROLOGY CONSULTATION NOTE  Kenneth [name] MRN: [id number] DOB: 1980-11-23  Referring provider: Dr. Ninetta Lights Primary care provider: Dr. Ninetta Lights  Reason for consult:  headache  HISTORY OF PRESENT ILLNESS: Kenneth [name] is a 35 year old right- handed man who is HIV positive and cigarette smoker, who presents for headache.  History obtained by patient and prior neurology note  Onset:  2004-2005 Location:  right frontal/retro-orbital Quality:  excruciating Intensity:  10/10 Aura:  no Prodrome:  no Associated symptoms:  Right eye conjunctival injection, lacrimation and droopy eyelid Duration:  30 to 40 minutes  Frequency:  Occurs in clusters during the warm months from around April-May until September.  They occur 2-3 times a day Triggers/exacerbating factors:  no Relieving factors:  no Activity:  cannot function during attack  Past NSAIDS:  multiple Past analgesics:  Fioricet, Percocet Past abortive triptans:  sumatriptan 5mg  NS Past antihypertensive medications:  no Past antidepressant medications:  no Past anticonvulsant medications:  no Past vitamins/Herbal/Supplements:  no Past antihistamines/decongestants:  no Other past therapy:  Prednisone during cluster  Current NSAIDS:  no Current analgesics:  no Current triptans:  Sumatriptan 5mg  NS Current anti-emetic:  no Current muscle relaxants:  no Current anti-anxiolytic:  no Current sleep aide:  melatonin Current Antihypertensive medications:  verapamil 180mg  CR  Current Antidepressant medications:  no Current Anticonvulsant medications:  topiramate 50mg  twice daily Other therapy:  O2 Other medication:  Atripla  Alcohol:  rarely Smoker:  no  He had an MRI of the brain with and without contrast on 10/10/06 to evaluate headache, which was personally reviewed and revealed and is unremarkable.  Labs from June:  RPR nonreactive, HIV viral load not detected, CD4% T cell 46% and CD4 T ecll abs 790; CBC and CMP  normal.  PAST MEDICAL HISTORY: Past Medical History:  Diagnosis Date  . Headaches, cluster   . HIV infection (HCC)     PAST SURGICAL HISTORY: No past surgical history on file.  MEDICATIONS: Current Outpatient Prescriptions on File Prior to Visit  Medication Sig Dispense Refill  . ATRIPLA 600-200-300 MG tablet TAKE 1 TABLET BY MOUTH AT BEDTIME 30 tablet 2  . Melatonin 200 MCG TABS Take 1 tablet by mouth at bedtime as needed.    Marland Kitchen omeprazole (PRILOSEC OTC) 20 MG tablet Take 20 mg by mouth daily as needed.     . predniSONE (DELTASONE) 20 MG tablet Take 1 tablet (20 mg total) by mouth daily with breakfast. 20 tablet 0  . SUMAtriptan (IMITREX) 5 MG/ACT nasal spray One spray in each nostril once. If no relief may repeat in 2 hours 4 Inhaler 11  . TOPAMAX 25 MG tablet Take 2 tablets (50 mg total) by mouth 2 (two) times daily. 120 tablet 11  . verapamil (CALAN-SR) 180 MG CR tablet TAKE 1 TABLET BY MOUTH EVERY NIGHT AT BEDTIME 30 tablet 6  . vitamin B-12 (CYANOCOBALAMIN) 250 MCG tablet Take 250 mcg by mouth daily.    . vitamin C (ASCORBIC ACID) 500 MG tablet Take 500 mg by mouth daily.    Marland Kitchen ZYCLARA 3.75 % CREA APPLY TOPICALLY TO THE AFFECTED AREA EVERY OTHER DAY. LEAVE ON FOR 6 HOURS THEN WASH OFF 28 each 0  . ZYCLARA PUMP 2.5 % CREA APPLY TOPICALLY EVERY OTHER DAY AND LEAVE ON FOR 6 HOURS THEN WASH OFF 7.5 g 0   No current facility-administered medications on file prior to visit.     ALLERGIES: No Known Allergies  FAMILY HISTORY: Family History  Problem Relation Age of Onset  . Stroke Mother   . Heart disease Father     SOCIAL HISTORY: Social History   Social History  . Marital status: Single    Spouse name: N/A  . Number of children: N/A  . Years of education: N/A   Occupational History  . Not on file.   Social History Main Topics  . Smoking status: Former Smoker    Years: 1.00    Types: Cigarettes    Start date: 09/25/2011  . Smokeless tobacco: Never Used      Comment: still taking Chantix  . Alcohol use 1.8 oz/week    3 Glasses of wine per week     Comment: rarely  . Drug use: No  . Sexual activity: Yes    Partners: Male    Birth control/ protection: Condom     Comment: pt. declined condoms   Other Topics Concern  . Not on file   Social History Narrative  . No narrative on file    REVIEW OF SYSTEMS: Constitutional: No fevers, chills, or sweats, no generalized fatigue, change in appetite Eyes: No visual changes, double vision, eye pain Ear, nose and throat: No hearing loss, ear pain, nasal congestion, sore throat Cardiovascular: No chest pain, palpitations Respiratory:  No shortness of breath at rest or with exertion, wheezes GastrointestinaI: No nausea, vomiting, diarrhea, abdominal pain, fecal incontinence Genitourinary:  No dysuria, urinary retention or frequency Musculoskeletal:  No neck pain, back pain Integumentary: No rash, pruritus, skin lesions Neurological: as above Psychiatric: No depression, insomnia, anxiety Endocrine: No palpitations, fatigue, diaphoresis, mood swings, change in appetite, change in weight, increased thirst Hematologic/Lymphatic:  No purpura, petechiae. Allergic/Immunologic: no itchy/runny eyes, nasal congestion, recent allergic reactions, rashes  PHYSICAL EXAM: Vitals:   01/22/16 1011  BP: 106/68  Pulse: 74   General: No acute distress.  Patient appears well-groomed.  Head:  Normocephalic/atraumatic Eyes:  fundi examined but not visualized Neck: supple, no paraspinal tenderness, full range of motion Back: No paraspinal tenderness Heart: regular rate and rhythm Lungs: Clear to auscultation bilaterally. Vascular: No carotid bruits. Neurological Exam: Mental status: alert and oriented to person, place, and time, recent and remote memory intact, fund of knowledge intact, attention and concentration intact, speech fluent and not dysarthric, language intact. Cranial nerves: CN I: not tested CN II:  pupils equal, round and reactive to light, visual fields intact CN III, IV, VI:  full range of motion, no nystagmus, no ptosis CN V: facial sensation intact CN VII: upper and lower face symmetric CN VIII: hearing intact CN IX, X: gag intact, uvula midline CN XI: sternocleidomastoid and trapezius muscles intact CN XII: tongue midline Bulk & Tone: normal, no fasciculations. Motor:  5/5 throughout Sensation: temperature and vibration sensation intact. Deep Tendon Reflexes:  2+ throughout, toes downgoing.  Finger to nose testing:  Without dysmetria.  Heel to shin:  Without dysmetria.  Gait:  Normal station and stride.  Able to turn and tandem walk. Romberg negative.  IMPRESSION: Episodic cluster headache  PLAN: 1.  As clusters last fairly long (almost half a year), I will titrate topiramate to 100mg  twice daily.  Continue verapamil 180mg  CR and melatonin 200mg . 2.  At beginning of next cycle, would prescribe prednisone taper 3.  Continue sumatriptan 5mg  NS and O2 for abortive therapy 4.  Follow up in 3 months.  Thank you for allowing me to take part in the care of this patient.  Shon MilletAdam Jaffe, DO  CC:  Johny SaxJeffrey Hatcher,  MD     

## 2016-02-12 ENCOUNTER — Other Ambulatory Visit: Payer: Self-pay | Admitting: *Deleted

## 2016-02-12 ENCOUNTER — Ambulatory Visit: Payer: Self-pay

## 2016-02-12 DIAGNOSIS — B20 Human immunodeficiency virus [HIV] disease: Secondary | ICD-10-CM

## 2016-02-12 MED ORDER — EFAVIRENZ-EMTRICITAB-TENOFOVIR 600-200-300 MG PO TABS: 1.0000 | ORAL_TABLET | Freq: Every day | ORAL | 2 refills | Status: DC

## 2016-02-16 ENCOUNTER — Encounter: Payer: Self-pay | Admitting: Infectious Diseases

## 2016-03-16 ENCOUNTER — Other Ambulatory Visit: Payer: Self-pay | Admitting: Neurology

## 2016-03-22 ENCOUNTER — Telehealth: Payer: Self-pay | Admitting: *Deleted

## 2016-03-22 NOTE — Telephone Encounter (Signed)
Patient called stating that nurse, [name] did patient assistance for him previously which allowed him to get his Imitrex. He has already signed the application previously with [name], however for some reason the company has told him it shows that he has [employer] insurance. Patient states this is incorrect and he is not insured. He is requesting that [name] call him in the AM. Kenneth MolaJacqueline Rayquon [name]

## 2016-03-23 NOTE — Telephone Encounter (Signed)
Spoke to nurse, [name] and she did not do the PA for Imitrex. Patient notified and advised to speak with [place] RoyaltyErin Odell, financial assistant and his call was routed to [place]. Kenneth MolaJacqueline [name]

## 2016-03-25 NOTE — Telephone Encounter (Signed)
Spoke with patient, he already has patient assistance for imitrex, gave him the number to call for a refill.

## 2016-04-01 ENCOUNTER — Other Ambulatory Visit: Payer: Self-pay

## 2016-04-08 ENCOUNTER — Other Ambulatory Visit: Payer: Self-pay

## 2016-04-15 ENCOUNTER — Ambulatory Visit: Payer: Self-pay | Admitting: Infectious Diseases

## 2016-05-04 ENCOUNTER — Ambulatory Visit: Payer: Self-pay | Admitting: Infectious Diseases

## 2016-05-06 ENCOUNTER — Other Ambulatory Visit: Payer: Self-pay | Admitting: Infectious Diseases

## 2016-05-06 DIAGNOSIS — B20 Human immunodeficiency virus [HIV] disease: Secondary | ICD-10-CM

## 2016-05-13 ENCOUNTER — Ambulatory Visit: Payer: Self-pay | Admitting: Neurology

## 2016-06-01 ENCOUNTER — Other Ambulatory Visit: Payer: Self-pay | Admitting: Neurology

## 2016-06-22 ENCOUNTER — Ambulatory Visit: Payer: Self-pay

## 2016-06-23 ENCOUNTER — Other Ambulatory Visit: Payer: Self-pay | Admitting: Infectious Diseases

## 2016-06-23 ENCOUNTER — Other Ambulatory Visit: Payer: Self-pay | Admitting: Neurology

## 2016-06-23 DIAGNOSIS — B20 Human immunodeficiency virus [HIV] disease: Secondary | ICD-10-CM

## 2016-06-24 ENCOUNTER — Encounter: Payer: Self-pay | Admitting: Infectious Diseases

## 2016-07-05 ENCOUNTER — Other Ambulatory Visit: Payer: Self-pay

## 2016-07-08 ENCOUNTER — Other Ambulatory Visit (INDEPENDENT_AMBULATORY_CARE_PROVIDER_SITE_OTHER): Payer: Self-pay

## 2016-07-08 ENCOUNTER — Other Ambulatory Visit: Payer: Self-pay | Admitting: *Deleted

## 2016-07-08 ENCOUNTER — Telehealth: Payer: Self-pay | Admitting: *Deleted

## 2016-07-08 DIAGNOSIS — B20 Human immunodeficiency virus [HIV] disease: Secondary | ICD-10-CM

## 2016-07-08 DIAGNOSIS — G43809 Other migraine, not intractable, without status migrainosus: Secondary | ICD-10-CM

## 2016-07-08 MED ORDER — SUMATRIPTAN 5 MG/ACT NA SOLN: NASAL | 11 refills | Status: DC

## 2016-07-08 NOTE — Telephone Encounter (Signed)
Patient called requesting an Rx for Imitrex be faxed to [employer] patient assistance. Rx faxed to [phone].

## 2016-07-09 LAB — T-HELPER CELL (CD4) - (RCID CLINIC ONLY)
CD4 T CELL ABS: 690 /uL (ref 400–2700)
CD4 T CELL HELPER: 41 % (ref 33–55)

## 2016-07-10 LAB — HIV-1 RNA QUANT-NO REFLEX-BLD
HIV 1 RNA Quant: <20 copies/mL
HIV-1 RNA Quant, Log: <1.3 Log copies/mL

## 2016-07-12 ENCOUNTER — Telehealth: Payer: Self-pay | Admitting: *Deleted

## 2016-07-12 NOTE — Telephone Encounter (Signed)
[place] call [place] and does not want to drive all the way here for his office visit 07/15/16 and would like for he doctor to call him and discus the lab results with him. Advised we do not do that as a normal practice and he advised "My doctor does it for me.". Advised the patient will let the provider know and someone will call him back. Advised will not be today but as soon as the doctor responds will call. He advised number in the chart is valid.

## 2016-07-13 ENCOUNTER — Telehealth: Payer: Self-pay | Admitting: Infectious Diseases

## 2016-07-13 NOTE — Telephone Encounter (Signed)
Pt called, left VM that I was trying to call.  Pt returned call.  We discussed his labs, he has had no problems with ART.  He will reschedule his appt.

## 2016-07-14 NOTE — Telephone Encounter (Signed)
Pt called

## 2016-07-15 ENCOUNTER — Other Ambulatory Visit: Payer: Self-pay | Admitting: Neurology

## 2016-07-19 ENCOUNTER — Ambulatory Visit: Payer: Self-pay | Admitting: Infectious Diseases

## 2016-08-16 ENCOUNTER — Encounter: Payer: Self-pay | Admitting: Infectious Diseases

## 2016-08-16 NOTE — Progress Notes (Unsigned)
I received an application renewal for this patient to continue receiving assistance with their sumatriptan nasal spray. The patient is actively seeing neurology and receiving headache medicine through them. I just wanted to confirm with you that you wanted to continue prescribing this nasal spray.   Thanks, Denny Peon

## 2016-08-16 NOTE — Progress Notes (Signed)
That is fine with me-- thanks  

## 2016-08-26 ENCOUNTER — Other Ambulatory Visit: Payer: Self-pay | Admitting: *Deleted

## 2016-08-26 DIAGNOSIS — G43809 Other migraine, not intractable, without status migrainosus: Secondary | ICD-10-CM

## 2016-08-26 MED ORDER — SUMATRIPTAN 5 MG/ACT NA SOLN: NASAL | 11 refills | Status: DC

## 2016-09-08 ENCOUNTER — Other Ambulatory Visit: Payer: Self-pay | Admitting: Neurology

## 2016-09-09 ENCOUNTER — Other Ambulatory Visit: Payer: Self-pay

## 2016-09-09 MED ORDER — TOPIRAMATE 50 MG PO TABS: ORAL_TABLET | ORAL | 1 refills | Status: DC

## 2016-09-23 ENCOUNTER — Telehealth: Payer: Self-pay | Admitting: *Deleted

## 2016-09-23 NOTE — Telephone Encounter (Signed)
Needing clarification for Imitrex prescription:  Directions, quantity, # of refills.  RN spoke with the patient about how he uses the Imitrex spray.  He uses it only in one nostril and one spray.  He does receive 6 doses in one box.  He shared that he mainly needs to use the medication during the spring and summer for his "cluster" headaches.  RN paged Dr. Drue SecondSnider to return call for clarification for [employer] who provides the medication through patient assistance.

## 2016-09-29 ENCOUNTER — Other Ambulatory Visit: Payer: Self-pay | Admitting: Infectious Diseases

## 2016-09-29 DIAGNOSIS — G43809 Other migraine, not intractable, without status migrainosus: Secondary | ICD-10-CM

## 2016-09-29 MED ORDER — SUMATRIPTAN 5 MG/ACT NA SOLN: NASAL | 11 refills | Status: DC

## 2016-09-29 NOTE — Telephone Encounter (Signed)
Printed.thanks.  

## 2016-09-30 ENCOUNTER — Other Ambulatory Visit: Payer: Self-pay | Admitting: *Deleted

## 2016-09-30 DIAGNOSIS — G43809 Other migraine, not intractable, without status migrainosus: Secondary | ICD-10-CM

## 2016-09-30 MED ORDER — SUMATRIPTAN 5 MG/ACT NA SOLN: NASAL | 3 refills | Status: DC

## 2016-11-09 ENCOUNTER — Telehealth: Payer: Self-pay | Admitting: *Deleted

## 2016-11-09 NOTE — Telephone Encounter (Signed)
Patient called requesting an increase in his Imitrex inhalers. I tried to explain that Dr. Ninetta LightsHatcher has prescribed 18 inhalers to last him 9 months and he said he gets 6 to a box and 3 boxes and that does not equal 18. I assured him that it does total 18 and he said that he uses them so frequently that 18 is not enough for 90 days. Please advise on patient's request. He has previously seen Neuro for his migraines. Kenneth MolaJacqueline Carlie Solorzano

## 2016-11-10 ENCOUNTER — Telehealth: Payer: Self-pay

## 2016-11-10 NOTE — Telephone Encounter (Signed)
i'd have him see neuro for uncontrolled migraines thanks

## 2016-11-10 NOTE — Telephone Encounter (Signed)
Kenneth [name], CMA spoke to patient regarding this. Please see her phone note. Kenneth [name]

## 2016-11-10 NOTE — Telephone Encounter (Signed)
Called Pt in regards to him needing to follow up with neuro for further evaluation of his recurrent cluster headaches. Mr.Larcom stated that he would not be going to follow up with the neurologist and told me that Dr.Hatcher "simply needs to give me more Imitrex" I told the Pt that at this time he's getting the the max dosage that the provider feels is appropriate within his scope, if he feels like he needs more and because the headaches have been ongoing for so long, it's imperative that he go to a specialist to find the underlying cause to the headaches and to make sure there's not a bigger medical concern/issue. The Pt told me that he doesn't want a new bill and went on to tell me (y'all need to fix it and figure it out.. I have headaches and I need more medicine, the amount y'all giving isn't enough. What do you want me to do just lay here and suffer?) I told the Pt that if he feels like his headaches are getting worst and he should seek immediate help from a urgent care or go to the nearest hospital. The Pt then stated that going to the hospital would result in another bill. I informed Pt that because he's on Juanell Fairlyyan White and ADAP that maybe he should talk to financial services at the hospital to help cut some of the medical cost. I informed him there may be some options to help with treatment however he should still go to the ER if he feels like the pain is increasing or if there are new signs and/or symptoms and although I can relate with not wanting to incur more bills he'd need to be seen by Neuro for further evaluation. Pt states that Imitrex "works fine, just need more of it" I then told him he's getting the amount professionally recommended and if that's not working he needs to follow up as soon as possible and he shouldn't have to become reliant on high doses of Imitrex. Pt stated he understood and call was concluded.

## 2016-11-18 ENCOUNTER — Other Ambulatory Visit: Payer: Self-pay | Admitting: Neurology

## 2016-12-06 ENCOUNTER — Ambulatory Visit: Payer: Self-pay

## 2016-12-08 ENCOUNTER — Other Ambulatory Visit: Payer: Self-pay | Admitting: Neurology

## 2016-12-14 ENCOUNTER — Telehealth: Payer: Self-pay | Admitting: *Deleted

## 2016-12-14 NOTE — Telephone Encounter (Signed)
Patient walked into clinic to renew RW/ADAP, speak with a nurse regarding topomax refill. RN reminded patient that Dr Everlena Cooper was prescribing this, and that Dr Ninetta Lights asked him to follow up with Dr Everlena Cooper regarding other treatment for uncontrolled migraines. Patient stated he could not afford the neurologist because they were not covered by RW/ADAP. RN gave him the [employer] application, advised him to ask [facility] if they participated in this program or offered one of their own. If they cannot accommodate him, patient needs to establish primary care at either [facility], [facility], or [facility] for primary care to see if they can address his migraines. Patient verbalized agreement, will contact [facility] today. Patient is scheduled to see Dr Ninetta Lights 10/10 with labs on 9/25. Andree Coss, RN

## 2016-12-16 ENCOUNTER — Encounter: Payer: Self-pay | Admitting: Infectious Diseases

## 2017-01-03 ENCOUNTER — Other Ambulatory Visit: Payer: Self-pay | Admitting: Neurology

## 2017-01-18 ENCOUNTER — Other Ambulatory Visit: Payer: Self-pay

## 2017-01-18 ENCOUNTER — Telehealth: Payer: Self-pay | Admitting: Neurology

## 2017-01-18 ENCOUNTER — Other Ambulatory Visit: Payer: Self-pay | Admitting: Infectious Diseases

## 2017-01-18 DIAGNOSIS — B20 Human immunodeficiency virus [HIV] disease: Secondary | ICD-10-CM

## 2017-01-18 NOTE — Telephone Encounter (Signed)
Patient came into the office to make an appt.  I offered the first available at 7:30 on 10/29, but he can't make it that early.  He lives out of town.  He is still having headaches and needs his meds (Topamax).  He said you are not filling it because you need to see him.  I was into Nov. And still only had a couple 7:30 appts.  Please call and advise.

## 2017-01-18 NOTE — Telephone Encounter (Signed)
Dr Georgeanna Harrison you want to offer this Pt any type of work-in or emergency spot? He has apparently been to his other Drs offices who participate in a program for underinsured patients trying to get refills but has not been successful

## 2017-01-19 ENCOUNTER — Telehealth: Payer: Self-pay | Admitting: Neurology

## 2017-01-19 LAB — COMPREHENSIVE METABOLIC PANEL
AG Ratio: 1.7 (calc) (ref 1.0–2.5)
ALKALINE PHOSPHATASE (APISO): 82 U/L (ref 40–115)
ALT: 10 U/L (ref 9–46)
AST: 12 U/L (ref 10–40)
Albumin: 4.2 g/dL (ref 3.6–5.1)
BUN: 12 mg/dL (ref 7–25)
CHLORIDE: 112 mmol/L — AB (ref 98–110)
CO2: 27 mmol/L (ref 20–32)
CREATININE: 1.17 mg/dL (ref 0.60–1.35)
Calcium: 9.3 mg/dL (ref 8.6–10.3)
Globulin: 2.5 g/dL (calc) (ref 1.9–3.7)
Glucose, Bld: 73 mg/dL (ref 65–99)
Potassium: 4.8 mmol/L (ref 3.5–5.3)
Sodium: 146 mmol/L (ref 135–146)
Total Bilirubin: 0.3 mg/dL (ref 0.2–1.2)
Total Protein: 6.7 g/dL (ref 6.1–8.1)

## 2017-01-19 LAB — LIPID PANEL
CHOLESTEROL: 141 mg/dL (ref <200)
HDL: 58 mg/dL (ref >40)
LDL CHOLESTEROL (CALC): 64 mg/dL
NON-HDL CHOLESTEROL (CALC): 83 mg/dL (ref <130)
TRIGLYCERIDES: 104 mg/dL (ref <150)
Total CHOL/HDL Ratio: 2.4 (calc) (ref <5.0)

## 2017-01-19 LAB — CBC WITH DIFFERENTIAL/PLATELET
BASOS ABS: 70 {cells}/uL (ref 0–200)
Basophils Relative: 0.8 %
EOS ABS: 220 {cells}/uL (ref 15–500)
EOS PCT: 2.5 %
HEMATOCRIT: 37 % — AB (ref 38.5–50.0)
HEMOGLOBIN: 12.2 g/dL — AB (ref 13.2–17.1)
LYMPHS ABS: 1848 {cells}/uL (ref 850–3900)
MCH: 29.2 pg (ref 27.0–33.0)
MCHC: 33 g/dL (ref 32.0–36.0)
MCV: 88.5 fL (ref 80.0–100.0)
MPV: 10.9 fL (ref 7.5–12.5)
Monocytes Relative: 10.6 %
NEUTROS ABS: 5729 {cells}/uL (ref 1500–7800)
NEUTROS PCT: 65.1 %
Platelets: 255 10*3/uL (ref 140–400)
RBC: 4.18 10*6/uL — ABNORMAL LOW (ref 4.20–5.80)
RDW: 13.2 % (ref 11.0–15.0)
Total Lymphocyte: 21 %
WBC: 8.8 10*3/uL (ref 3.8–10.8)
WBCMIX: 933 {cells}/uL (ref 200–950)

## 2017-01-19 LAB — T-HELPER CELL (CD4) - (RCID CLINIC ONLY)
CD4 % Helper T Cell: 37 % (ref 33–55)
CD4 T CELL ABS: 670 /uL (ref 400–2700)

## 2017-01-19 LAB — RPR: RPR: NONREACTIVE

## 2017-01-19 NOTE — Telephone Encounter (Signed)
LM on VM for Pt to rtrn call 

## 2017-01-19 NOTE — Telephone Encounter (Signed)
In order to refill medications, I need to see them within a year.  He cancelled his previous appointment months ago and never rescheduled.  I wouldn't use a work-in/emergency slot since it is not an emergency.  In these situations, we recommend that his PCP refill his medication until he is seen again.  We can put him on the waitlist in the meantime.

## 2017-01-19 NOTE — Telephone Encounter (Signed)
Spoke with Pt, advsd we are unable to refill medicines until he has been seen. Advsd him to contact his PCP for refills in the interim. Also advsd him he will need to keep appts in the future to continue to receive medication refills.

## 2017-01-19 NOTE — Telephone Encounter (Signed)
Pt returned your call.  

## 2017-01-20 LAB — URINE CYTOLOGY ANCILLARY ONLY
Chlamydia: NEGATIVE
Neisseria Gonorrhea: NEGATIVE

## 2017-01-20 LAB — HIV-1 RNA QUANT-NO REFLEX-BLD
HIV 1 RNA QUANT: NOT DETECTED {copies}/mL
HIV-1 RNA QUANT, LOG: NOT DETECTED {Log_copies}/mL

## 2017-01-26 ENCOUNTER — Other Ambulatory Visit: Payer: Self-pay | Admitting: Neurology

## 2017-02-01 ENCOUNTER — Ambulatory Visit: Payer: Self-pay | Admitting: Infectious Diseases

## 2017-02-02 ENCOUNTER — Encounter: Payer: Self-pay | Admitting: Infectious Diseases

## 2017-02-02 ENCOUNTER — Ambulatory Visit (INDEPENDENT_AMBULATORY_CARE_PROVIDER_SITE_OTHER): Payer: Self-pay | Admitting: Infectious Diseases

## 2017-02-02 VITALS — BP 113/73 | HR 71 | Temp 98.5°F | Ht 72.0 in | Wt 163.0 lb

## 2017-02-02 DIAGNOSIS — Z23 Encounter for immunization: Secondary | ICD-10-CM

## 2017-02-02 DIAGNOSIS — B20 Human immunodeficiency virus [HIV] disease: Secondary | ICD-10-CM

## 2017-02-02 DIAGNOSIS — Z79899 Other long term (current) drug therapy: Secondary | ICD-10-CM

## 2017-02-02 DIAGNOSIS — A630 Anogenital (venereal) warts: Secondary | ICD-10-CM

## 2017-02-02 DIAGNOSIS — Z113 Encounter for screening for infections with a predominantly sexual mode of transmission: Secondary | ICD-10-CM

## 2017-02-02 DIAGNOSIS — G43809 Other migraine, not intractable, without status migrainosus: Secondary | ICD-10-CM

## 2017-02-02 DIAGNOSIS — F17210 Nicotine dependence, cigarettes, uncomplicated: Secondary | ICD-10-CM

## 2017-02-02 MED ORDER — SUMATRIPTAN 5 MG/ACT NA SOLN: NASAL | 3 refills | Status: DC

## 2017-02-02 MED ORDER — TOPIRAMATE 50 MG PO TABS: ORAL_TABLET | ORAL | 1 refills | Status: DC

## 2017-02-02 NOTE — Progress Notes (Signed)
   Subjective:    Patient ID: Kenneth [name], male    DOB: Aug 29, 1980, 36 y.o.   MRN: [id number]  HPI 36 yo M with hx of HIV+ (Dx 2006?), tension/cluster headaches.Was prev seen by neuro and given intranasal imitrex, topomax and verapamil po.  Has not had neuro f/u for > 1 year.   Continues to have headaches. He has not been able to get his topomax. Has neuro appt on 10-30. Feels like his quality of life is worse since having more headaches.  Has verapamil.  No problems with atripla. Has otherwise been feeling well.  Asks about long acting injectables.   HIV 1 RNA Quant (copies/mL)  Date Value  01/18/2017 <20 NOT DETECTED  07/08/2016 <20 NOT DETECTED  10/01/2015 <20   CD4 T Cell Abs (/uL)  Date Value  01/18/2017 670  07/08/2016 690  10/01/2015 790    Review of Systems  Constitutional: Positive for unexpected weight change. Negative for appetite change, chills and fever.  Eyes: Negative for visual disturbance.  Respiratory: Negative for cough and shortness of breath.   Gastrointestinal: Negative for constipation and diarrhea.  Genitourinary: Negative for difficulty urinating.  Neurological: Positive for headaches.  Psychiatric/Behavioral: Positive for dysphoric mood. Negative for sleep disturbance.  partner is positive.  C/o rectal pain Wt down 17#    Please see HPI. 12 point ROS o/w (-)  Objective:   Physical Exam  Constitutional: He is oriented to person, place, and time. He appears well-developed and well-nourished.  HENT:  Mouth/Throat: No oropharyngeal exudate.  Eyes: Pupils are equal, round, and reactive to light. EOM are normal.  Neck: Neck supple.  Cardiovascular: Normal rate, regular rhythm and normal heart sounds.   Pulmonary/Chest: Effort normal and breath sounds normal.  Abdominal: Soft. Bowel sounds are normal. There is no tenderness. There is no rebound.  Genitourinary:     Musculoskeletal: He exhibits no edema.  Lymphadenopathy:    He has no  cervical adenopathy.  Neurological: He is alert and oriented to person, place, and time. No cranial nerve deficit. Coordination normal.  Psychiatric: He has a normal mood and affect.       Assessment & Plan:

## 2017-02-02 NOTE — Addendum Note (Signed)
Addended by: [name] on: 02/02/2017 12:47 PM   Modules accepted: Orders

## 2017-02-02 NOTE — Assessment & Plan Note (Signed)
Doing well Offered/refused condoms.  Continue his current art Has gotten flu shot.  Needs mening vax.  rtc in 9 months.

## 2017-02-02 NOTE — Assessment & Plan Note (Signed)
No visual lesions Offered HRA.  rec for him to use prn HCT cream for now.

## 2017-02-02 NOTE — Assessment & Plan Note (Signed)
Will refill his rx til he sees neuro at end of month.

## 2017-02-02 NOTE — Assessment & Plan Note (Signed)
Has quit 

## 2017-02-11 ENCOUNTER — Ambulatory Visit: Payer: Self-pay | Admitting: Infectious Diseases

## 2017-02-21 ENCOUNTER — Ambulatory Visit (INDEPENDENT_AMBULATORY_CARE_PROVIDER_SITE_OTHER): Payer: Self-pay | Admitting: Neurology

## 2017-02-21 ENCOUNTER — Encounter: Payer: Self-pay | Admitting: Neurology

## 2017-02-21 VITALS — BP 108/74 | HR 62 | Ht 72.0 in | Wt 162.7 lb

## 2017-02-21 DIAGNOSIS — G44019 Episodic cluster headache, not intractable: Secondary | ICD-10-CM

## 2017-02-21 NOTE — Progress Notes (Signed)
NEUROLOGY FOLLOW UP OFFICE NOTE  Kenneth [name] [id number]  HISTORY OF PRESENT ILLNESS: Kenneth [name] is a 36 year old right- handed man who is HIV positive and cigarette smoker, who follows up for episodic cluster headaches.  UPDATE: His cluster cycle ended last month.  It was severe. Intensity:  10/10 Duration:  15 to 30 minutes Frequency:  During cluster cycle from April/May to September, occurred several times a day Current NSAIDS:  no Current analgesics:  no Current triptans:  Sumatriptan 5mg  NS Current anti-emetic:  no Current muscle relaxants:  no Current anti-anxiolytic:  no Current sleep aide:  melatonin Current Antihypertensive medications:  verapamil 180mg  CR (he self increased dose to 360mg  for a month with no improvement) Current Antidepressant medications:  no Current Anticonvulsant medications:  topiramate self increased dose from 100mg  twice daily to 200mg  twice daily with no improvement. Other therapy:  O2 Other medication:  Atripla  Depression/anxiety:  stable.   HISTORY: Onset:  2004-2005 Location:  right frontal/retro-orbital Quality:  excruciating Intensity:  10/10 Aura:  no Prodrome:  no Associated symptoms:  Right eye conjunctival injection, lacrimation and droopy eyelid Duration:  30 to 40 minutes  Frequency:  Occurs in clusters during the warm months from around April-May until September.  They occur 2-3 times a day Triggers/exacerbating factors:  no Relieving factors:  no Activity:  cannot function during attack   Past NSAIDS:  multiple Past analgesics:  Fioricet, Percocet Past abortive triptans:  sumatriptan 5mg  NS Past antihypertensive medications:  no Past antidepressant medications:  no Past anticonvulsant medications:  no Past vitamins/Herbal/Supplements:  no Past antihistamines/decongestants:  no Other past therapy:  Prednisone during cluster    Alcohol:  rarely Smoker:  no   He had an MRI of the brain with and  without contrast on 10/10/06 to evaluate headache, which was personally reviewed and revealed and is unremarkable.  PAST MEDICAL HISTORY: Past Medical History:  Diagnosis Date  . Headaches, cluster   . HIV infection [facility])     MEDICATIONS: Current Outpatient Prescriptions on File Prior to Visit  Medication Sig Dispense Refill  . ATRIPLA 600-200-300 MG tablet TAKE 1 TABLET BY MOUTH EVERY NIGHT AT BEDTIME 90 tablet 3  . Melatonin 200 MCG TABS Take 1 tablet by mouth at bedtime as needed.    Marland Kitchen omeprazole (PRILOSEC OTC) 20 MG tablet Take 20 mg by mouth daily as needed.     . SUMAtriptan (IMITREX) 5 MG/ACT nasal spray One spray in each nostril once. If no relief may repeat in 2 hours 18 Inhaler 3  . topiramate (TOPAMAX) 50 MG tablet TAKE 2 TABLETS(100 MG) BY MOUTH TWICE DAILY 120 tablet 1  . verapamil (CALAN-SR) 180 MG CR tablet TAKE 1 TABLET BY MOUTH EVERY NIGHT AT BEDTIME 90 tablet 3  . vitamin B-12 (CYANOCOBALAMIN) 250 MCG tablet Take 250 mcg by mouth daily.    . vitamin C (ASCORBIC ACID) 500 MG tablet Take 500 mg by mouth daily.    Marland Kitchen ZYCLARA 3.75 % CREA APPLY TOPICALLY TO THE AFFECTED AREA EVERY OTHER DAY. LEAVE ON FOR 6 HOURS THEN WASH OFF (Patient not taking: Reported on 02/02/2017) 28 each 0  . ZYCLARA PUMP 2.5 % CREA APPLY TOPICALLY EVERY OTHER DAY AND LEAVE ON FOR 6 HOURS THEN WASH OFF (Patient not taking: Reported on 02/02/2017) 7.5 g 0   No current facility-administered medications on file prior to visit.     ALLERGIES: No Known Allergies  FAMILY HISTORY: Family History  Problem Relation  Age of Onset  . Stroke Mother   . Heart disease Father     SOCIAL HISTORY: Social History   Social History  . Marital status: Single    Spouse name: N/A  . Number of children: N/A  . Years of education: N/A   Occupational History  . Not on file.   Social History Main Topics  . Smoking status: Former Smoker    Years: 1.00    Types: Cigarettes    Start date: 09/25/2011  . Smokeless  tobacco: Never Used  . Alcohol use 1.8 oz/week    3 Glasses of wine per week     Comment: rarely  . Drug use: No  . Sexual activity: Yes    Partners: Male    Birth control/ protection: Condom     Comment: pt. declined condoms   Other Topics Concern  . Not on file   Social History Narrative  . No narrative on file    REVIEW OF SYSTEMS: Constitutional: No fevers, chills, or sweats, no generalized fatigue, change in appetite Eyes: No visual changes, double vision, eye pain Ear, nose and throat: No hearing loss, ear pain, nasal congestion, sore throat Cardiovascular: No chest pain, palpitations Respiratory:  No shortness of breath at rest or with exertion, wheezes GastrointestinaI: No nausea, vomiting, diarrhea, abdominal pain, fecal incontinence Genitourinary:  No dysuria, urinary retention or frequency Musculoskeletal:  No neck pain, back pain Integumentary: No rash, pruritus, skin lesions Neurological: as above Psychiatric: No depression, insomnia, anxiety Endocrine: No palpitations, fatigue, diaphoresis, mood swings, change in appetite, change in weight, increased thirst Hematologic/Lymphatic:  No purpura, petechiae. Allergic/Immunologic: no itchy/runny eyes, nasal congestion, recent allergic reactions, rashes  PHYSICAL EXAM: Vitals:   02/21/17 0731  BP: 108/74  Pulse: 62  SpO2: 100%   General: No acute distress.  Patient appears well-groomed.  normal body habitus. Head:  Normocephalic/atraumatic Eyes:  Fundi examined but not visualized Neck: supple, no paraspinal tenderness, full range of motion Heart:  Regular rate and rhythm Lungs:  Clear to auscultation bilaterally Back: No paraspinal tenderness Neurological Exam: alert and oriented to person, place, and time. Attention span and concentration intact, recent and remote memory intact, fund of knowledge intact.  Speech fluent and not dysarthric, language intact.  CN II-XII intact. Bulk and tone normal, muscle strength  5/5 throughout.  Sensation to light touch  intact.  Deep tendon reflexes 2+ throughout.  Finger to nose testing intact.  Gait normal, Romberg negative.  IMPRESSION: Cluster headache  PLAN: 1.  Advised to decrease topiramate back to 100mg  twice daily since it did not improve headaches. 2.  Continue verapamil 180mg  CR daily.  I advised that he not increase dose without supervision due to potential cardiac side effects. 3.  Continue melatonin 4.  He is already failed high doses of verapamil, topiramate and melatonin.  He will follow up in 4 months to discuss starting another preventative.  At that time, we will have a month before his expected cluster cycle to start, so we can discuss alternative treatment options.  Shon MilletAdam Teliyah Royal, DO  CC: Kenneth SaxJeffrey Hatcher, MD

## 2017-02-21 NOTE — Patient Instructions (Signed)
1.  Continue verapamil 180mg  CR daily, melatonin and topiramate 100mg  twice daily 2.  Use the oxygen and sumatriptan spray as needed 3.  Follow up in March and we can discuss other options.

## 2017-05-03 ENCOUNTER — Telehealth: Payer: Self-pay | Admitting: *Deleted

## 2017-05-03 NOTE — Telephone Encounter (Signed)
Patient called and asked to speak with Kenneth [name] for patient assistance. Advised she so out of the office and will leave a message for her to call him back.

## 2017-05-18 ENCOUNTER — Encounter: Payer: Self-pay | Admitting: Infectious Diseases

## 2017-05-24 ENCOUNTER — Other Ambulatory Visit: Payer: Self-pay | Admitting: Infectious Diseases

## 2017-05-24 DIAGNOSIS — B20 Human immunodeficiency virus [HIV] disease: Secondary | ICD-10-CM

## 2017-05-25 ENCOUNTER — Telehealth: Payer: Self-pay | Admitting: *Deleted

## 2017-05-25 NOTE — Telephone Encounter (Signed)
Looks like neuro is writing this.  thanks

## 2017-05-25 NOTE — Telephone Encounter (Signed)
Patient called for update on refills.

## 2017-05-25 NOTE — Telephone Encounter (Signed)
Patient is due for a refill. Per Dr Everlena CooperJaffe note are there any adjustments you would like to make to the Rx please advise.

## 2017-05-27 ENCOUNTER — Other Ambulatory Visit: Payer: Self-pay

## 2017-05-27 DIAGNOSIS — B20 Human immunodeficiency virus [HIV] disease: Secondary | ICD-10-CM

## 2017-05-27 MED ORDER — EFAVIRENZ-EMTRICITAB-TENOFOVIR 600-200-300 MG PO TABS: 1.0000 | ORAL_TABLET | Freq: Every day | ORAL | 2 refills | Status: DC

## 2017-05-31 NOTE — Telephone Encounter (Signed)
Spoke with patient, explained that Dr Everlena CooperJaffe will discuss medication management/changes at his appointment 2/28.  Patient asked for blank patient assistance forms to be mailed, should Dr Everlena CooperJaffe want to continue Imitrex or Topomax.  RN confirmed address, sent blank forms per Timmothy SoursAshley Rehner. Andree CossHowell, Pearlee Arvizu M, RN

## 2017-06-21 NOTE — Telephone Encounter (Signed)
Patient called, wanted to know why his imitrex and verapamil refills were denied. RN reminded patient that Dr Everlena CooperJaffe was going to see him 2/28, would be adjusting his medications, that Dr Ninetta LightsHatcher was no longer writing prescriptions for migraine management.  Patient has filled out patient assistance forms for these medications, just needs prescriptions.  He will bring them to Dr Moises BloodJaffe's appointment on 2/28. Andree CossHowell, Whitnee Orzel M, RN

## 2017-06-23 ENCOUNTER — Other Ambulatory Visit (INDEPENDENT_AMBULATORY_CARE_PROVIDER_SITE_OTHER): Payer: Self-pay

## 2017-06-23 ENCOUNTER — Ambulatory Visit (INDEPENDENT_AMBULATORY_CARE_PROVIDER_SITE_OTHER): Payer: Self-pay | Admitting: Neurology

## 2017-06-23 ENCOUNTER — Encounter: Payer: Self-pay | Admitting: Neurology

## 2017-06-23 ENCOUNTER — Other Ambulatory Visit: Payer: Self-pay | Admitting: *Deleted

## 2017-06-23 VITALS — BP 110/70 | HR 70 | Ht 72.0 in | Wt 190.4 lb

## 2017-06-23 DIAGNOSIS — G43809 Other migraine, not intractable, without status migrainosus: Secondary | ICD-10-CM

## 2017-06-23 DIAGNOSIS — G44019 Episodic cluster headache, not intractable: Secondary | ICD-10-CM

## 2017-06-23 LAB — COMPREHENSIVE METABOLIC PANEL
ALBUMIN: 4 g/dL (ref 3.5–5.2)
ALK PHOS: 66 U/L (ref 39–117)
ALT: 21 U/L (ref 0–53)
AST: 21 U/L (ref 0–37)
BILIRUBIN TOTAL: 0.3 mg/dL (ref 0.2–1.2)
BUN: 10 mg/dL (ref 6–23)
CO2: 29 mEq/L (ref 19–32)
Calcium: 9.4 mg/dL (ref 8.4–10.5)
Chloride: 106 mEq/L (ref 96–112)
Creatinine, Ser: 0.93 mg/dL (ref 0.40–1.50)
GFR: 117.15 mL/min (ref >60.00)
GLUCOSE: 89 mg/dL (ref 70–99)
POTASSIUM: 4 meq/L (ref 3.5–5.1)
SODIUM: 140 meq/L (ref 135–145)
TOTAL PROTEIN: 7.2 g/dL (ref 6.0–8.3)

## 2017-06-23 LAB — CBC
HCT: 40 % (ref 39.0–52.0)
HEMOGLOBIN: 13.3 g/dL (ref 13.0–17.0)
MCHC: 33.1 g/dL (ref 30.0–36.0)
MCV: 88.6 fl (ref 78.0–100.0)
Platelets: 247 10*3/uL (ref 150.0–400.0)
RBC: 4.52 Mil/uL (ref 4.22–5.81)
RDW: 13.6 % (ref 11.5–15.5)
WBC: 7.7 10*3/uL (ref 4.0–10.5)

## 2017-06-23 LAB — TSH: TSH: 1.6 u[IU]/mL (ref 0.35–4.50)

## 2017-06-23 LAB — T4, FREE: Free T4: 0.7 ng/dL (ref 0.60–1.60)

## 2017-06-23 MED ORDER — TOPIRAMATE 50 MG PO TABS: ORAL_TABLET | ORAL | 1 refills | Status: DC

## 2017-06-23 MED ORDER — SUMATRIPTAN 5 MG/ACT NA SOLN: NASAL | 3 refills | Status: DC

## 2017-06-23 NOTE — Progress Notes (Signed)
NEUROLOGY FOLLOW UP OFFICE NOTE  Kenneth ConnersJames L [name] [id number]  HISTORY OF PRESENT ILLNESS: Kenneth PulseJames [name] is a 37 year old right- handed man who is HIV positive and cigarette smoker, who follows up for episodic cluster headaches.   UPDATE: He has not had a cluster since last visit.  His cluster headaches typically do not restart until April. Current NSAIDS:  no Current analgesics:  no Current triptans:  Sumatriptan 5mg  NS Current anti-emetic:  no Current muscle relaxants:  no Current anti-anxiolytic:  no Current sleep aide:  melatonin 200mcg Current Antihypertensive medications:  verapamil 180mg  CR (higher doses ineffective) Current Antidepressant medications:  no Current Anticonvulsant medications:  topiramate 100mg  twice daily (higher doses ineffective) Other therapy:  O2 Other medication:  Atripla   Depression/anxiety:  stable.   HISTORY: Onset:  2004-2005 Location:  right frontal/retro-orbital Quality:  excruciating Intensity:  10/10 Aura:  no Prodrome:  no Associated symptoms:  Right eye conjunctival injection, lacrimation and droopy eyelid Duration:  30 to 40 minutes  Frequency:  Occurs in clusters during the warm months from around April-May until September.  They occur 2-3 times a day Triggers/exacerbating factors:  no Relieving factors:  no Activity:  cannot function during attack   Past NSAIDS:  multiple Past analgesics:  Fioricet, Percocet Past abortive triptans:  sumatriptan 5mg  NS Past antihypertensive medications:  no Past antidepressant medications:  no Past anticonvulsant medications:  no Past vitamins/Herbal/Supplements:  no Past antihistamines/decongestants:  no Other past therapy:  Prednisone during cluster   Alcohol:  rarely Smoker:  no   He had an MRI of the brain with and without contrast on 10/10/06 to evaluate headache, which was personally reviewed and revealed and is unremarkable.  PAST MEDICAL HISTORY: Past Medical History:    Diagnosis Date  . Headaches, cluster   . HIV infection (HCC)     MEDICATIONS: Current Outpatient Medications on File Prior to Visit  Medication Sig Dispense Refill  . efavirenz-emtricitabine-tenofovir (ATRIPLA) 600-200-300 MG tablet Take 1 tablet by mouth at bedtime. 90 tablet 2  . Melatonin 200 MCG TABS Take 1 tablet by mouth at bedtime as needed.    Marland Kitchen. omeprazole (PRILOSEC OTC) 20 MG tablet Take 20 mg by mouth daily as needed.     . SUMAtriptan (IMITREX) 5 MG/ACT nasal spray One spray in each nostril once. If no relief may repeat in 2 hours 18 Inhaler 3  . topiramate (TOPAMAX) 50 MG tablet TAKE 2 TABLETS(100 MG) BY MOUTH TWICE DAILY 120 tablet 1  . verapamil (CALAN-SR) 180 MG CR tablet TAKE 1 TABLET BY MOUTH EVERY NIGHT AT BEDTIME 90 tablet 3  . vitamin B-12 (CYANOCOBALAMIN) 250 MCG tablet Take 250 mcg by mouth daily.    . vitamin C (ASCORBIC ACID) 500 MG tablet Take 500 mg by mouth daily.    Marland Kitchen. ZYCLARA 3.75 % CREA APPLY TOPICALLY TO THE AFFECTED AREA EVERY OTHER DAY. LEAVE ON FOR 6 HOURS THEN WASH OFF (Patient not taking: Reported on 02/02/2017) 28 each 0  . ZYCLARA PUMP 2.5 % CREA APPLY TOPICALLY EVERY OTHER DAY AND LEAVE ON FOR 6 HOURS THEN WASH OFF (Patient not taking: Reported on 02/02/2017) 7.5 g 0   No current facility-administered medications on file prior to visit.     ALLERGIES: No Known Allergies  FAMILY HISTORY: Family History  Problem Relation Age of Onset  . Stroke Mother   . Heart disease Father     SOCIAL HISTORY: Social History   Socioeconomic History  . Marital status: Single  Spouse name: Not on file  . Number of children: Not on file  . Years of education: Not on file  . Highest education level: Not on file  Social Needs  . Financial resource strain: Not on file  . Food insecurity - worry: Not on file  . Food insecurity - inability: Not on file  . Transportation needs - medical: Not on file  . Transportation needs - non-medical: Not on file   Occupational History  . Not on file  Tobacco Use  . Smoking status: Former Smoker    Years: 1.00    Types: Cigarettes    Start date: 09/25/2011  . Smokeless tobacco: Never Used  Substance and Sexual Activity  . Alcohol use: Yes    Alcohol/week: 1.8 oz    Types: 3 Glasses of wine per week    Comment: rarely  . Drug use: No  . Sexual activity: Yes    Partners: Male    Birth control/protection: Condom    Comment: pt. declined condoms  Other Topics Concern  . Not on file  Social History Narrative  . Not on file    REVIEW OF SYSTEMS: Constitutional: No fevers, chills, or sweats, no generalized fatigue, change in appetite Eyes: No visual changes, double vision, eye pain Ear, nose and throat: No hearing loss, ear pain, nasal congestion, sore throat Cardiovascular: No chest pain, palpitations Respiratory:  No shortness of breath at rest or with exertion, wheezes GastrointestinaI: No nausea, vomiting, diarrhea, abdominal pain, fecal incontinence Genitourinary:  No dysuria, urinary retention or frequency Musculoskeletal:  No neck pain, back pain Integumentary: No rash, pruritus, skin lesions Neurological: as above Psychiatric: No depression, insomnia, anxiety Endocrine: No palpitations, fatigue, diaphoresis, mood swings, change in appetite, change in weight, increased thirst Hematologic/Lymphatic:  No purpura, petechiae. Allergic/Immunologic: no itchy/runny eyes, nasal congestion, recent allergic reactions, rashes  PHYSICAL EXAM: Vitals:  BP 110/70, Pulse 70, Wt 190 lb 6 oz, Ht 6' General: No acute distress.  Patient appears well-groomed.  normal body habitus. Head:  Normocephalic/atraumatic Eyes:  Fundi examined but not visualized Neck: supple, no paraspinal tenderness, full range of motion Heart:  Regular rate and rhythm Lungs:  Clear to auscultation bilaterally Back: No paraspinal tenderness Neurological Exam: alert and oriented to person, place, and time. Attention span and  concentration intact, recent and remote memory intact, fund of knowledge intact.  Speech fluent and not dysarthric, language intact.  CN II-XII intact. Bulk and tone normal, muscle strength 5/5 throughout.  Sensation to light touch  intact.  Deep tendon reflexes 2+ throughout.  Finger to nose testing intact.  Gait normal  IMPRESSION: Episodic cluster headache.  Verapamil, topiramate and melatonin have been ineffective.  PLAN: 1.  We will try Lithium:  BUN 10/Cr 0.93, TSH 1.60, free T4 0.70.  Start 300mg  three times daily.  Check Lithium level in 2 weeks.  Discussed symptoms of lithium toxicity such as abdominal pain, diarrhea and vomiting.  Discussed that tremor may be side effect. 2.  He will discontinue verapamil, decrease topiramate to 50mg  twice daily and continue melatonin 3.  Sumatriptan 5mg  NS and O2 for abortive therapy 4.  Follow up in 3 months.  Shon Millet, DO  CC:  Johny Sax, MD

## 2017-06-23 NOTE — Patient Instructions (Signed)
1.  We will get the topiramate and sumatriptan refilled for you 2.  Continue melatonin. 3.  I will get back to you tomorrow about prescribing either lithium or gabapentin. 4.  Follow up in 3 months.

## 2017-06-24 ENCOUNTER — Encounter: Payer: Self-pay | Admitting: Neurology

## 2017-06-24 ENCOUNTER — Telehealth: Payer: Self-pay | Admitting: Neurology

## 2017-06-24 DIAGNOSIS — G43809 Other migraine, not intractable, without status migrainosus: Secondary | ICD-10-CM

## 2017-06-24 MED ORDER — SUMATRIPTAN 5 MG/ACT NA SOLN: NASAL | 3 refills | Status: DC

## 2017-06-24 MED ORDER — LITHIUM CARBONATE 300 MG PO TABS: 300.0000 mg | ORAL_TABLET | Freq: Three times a day (TID) | ORAL | 2 refills | Status: DC

## 2017-06-24 MED ORDER — LITHIUM CARBONATE 300 MG PO TABS: 300.0000 mg | ORAL_TABLET | Freq: Three times a day (TID) | ORAL | 1 refills | Status: DC

## 2017-06-24 MED ORDER — TOPIRAMATE 50 MG PO TABS: 50.0000 mg | ORAL_TABLET | Freq: Two times a day (BID) | ORAL | 1 refills | Status: DC

## 2017-06-24 NOTE — Telephone Encounter (Signed)
Patient made aware medications were sent to [facility]. [facility] Medications re[facility] to [facility].

## 2017-06-24 NOTE — Telephone Encounter (Signed)
Pt called and said he saw Dr Everlena CooperJaffe yesterday and was supposed to have a prescription called in but the pharmacy does not have it

## 2017-06-28 ENCOUNTER — Telehealth: Payer: Self-pay | Admitting: Neurology

## 2017-06-28 NOTE — Telephone Encounter (Signed)
Kenneth [name] called regarding his Lithium medication. He [facility]  Which is [facility] to his house. Please Call. Thanks

## 2017-06-29 NOTE — Telephone Encounter (Signed)
Lm that all three of his medications, including the Lithium, [facility] but if this was incorrect or needed to be changed he could call back.

## 2017-06-30 ENCOUNTER — Telehealth: Payer: Self-pay | Admitting: Neurology

## 2017-06-30 MED ORDER — LITHIUM CARBONATE 300 MG PO TABS: 300.0000 mg | ORAL_TABLET | Freq: Three times a day (TID) | ORAL | 1 refills | Status: DC

## 2017-06-30 NOTE — Telephone Encounter (Signed)
Pt called and said the pharmacy is not receiving the prescription for Lithium [facility] # [phone] Fax# [phone]

## 2017-06-30 NOTE — Telephone Encounter (Signed)
RX sent to number provided.

## 2017-06-30 NOTE — Telephone Encounter (Signed)
error 

## 2017-07-01 ENCOUNTER — Telehealth: Payer: Self-pay | Admitting: Neurology

## 2017-07-01 DIAGNOSIS — G43809 Other migraine, not intractable, without status migrainosus: Secondary | ICD-10-CM

## 2017-07-01 NOTE — Telephone Encounter (Signed)
Spoke with patient states that pharmacy program will not cover Lithium medication. They told patient he needs to get Eskalith 450 mg which is covered under his insurance program through his pharmacy.  Please advise

## 2017-07-01 NOTE — Telephone Encounter (Signed)
I need to start with 300mg  pills, not 450mg .  The dosing is very specific

## 2017-07-01 NOTE — Telephone Encounter (Signed)
°  1.     Name of medication: Lithium  2.     How are you currently taking this medication (dosage and times per day)? Not sure  3.     Are you having a reaction (difficulty breathing--STAT)? No  4.     What is your medication issue? Pharmacy will not cover/ but there is another that will. Please Call.

## 2017-07-01 NOTE — Telephone Encounter (Signed)
1.     Name of medication: Lithium  2.     How are you currently taking this medication (dosage and times per day)? Not sure  3.     Are you having a reaction (difficulty breathing--STAT)? No  4.     What is your medication issue? Pharmacy will not cover/ but there is another that will. Please Call.

## 2017-07-04 MED ORDER — DIVALPROEX SODIUM ER 500 MG PO TB24: 500.0000 mg | ORAL_TABLET | Freq: Every day | ORAL | 1 refills | Status: DC

## 2017-07-04 MED ORDER — TOPIRAMATE 100 MG PO TABS: 100.0000 mg | ORAL_TABLET | Freq: Two times a day (BID) | ORAL | 1 refills | Status: DC

## 2017-07-04 NOTE — Telephone Encounter (Signed)
Will contact pharmacy to make sure if patient is allow to take Eskalith 300 mg

## 2017-07-04 NOTE — Telephone Encounter (Signed)
Please advise after speaking with pharmacy since Lithium is not covered under patient insurance plan

## 2017-07-04 NOTE — Telephone Encounter (Signed)
I would start a different medication instead.  I would like to start Depakote ER 500mg  at bedtime.  He may increase topiramate back to 100mg  twice daily.  I would like to repeat CBC and CMP prior to his follow up (if not already ordered).

## 2017-07-04 NOTE — Telephone Encounter (Signed)
Labs and medication ordered as requested. Contacted patient and left message on machine about changes to his medication. Patient will need to contact back to make aware of labs ordered.

## 2017-07-05 MED ORDER — TOPIRAMATE 100 MG PO TABS: 100.0000 mg | ORAL_TABLET | Freq: Two times a day (BID) | ORAL | 1 refills | Status: DC

## 2017-07-05 NOTE — Telephone Encounter (Signed)
Patient aware of medications and also stated medication needs to be brand names. Patient will call back to schedule lab appt

## 2017-07-06 NOTE — Telephone Encounter (Signed)
Spoke with [facility] and Topiramate or Topamax are no longer covered under patients insurance plan [facility]. A new script for Amitriptyline, Keppra, Phenobarb, Phenytoin, or carbamazepine are some to consider for alternatives or what you believe is best for the patient.

## 2017-07-06 NOTE — Telephone Encounter (Signed)
Those listed medications are not used to treat cluster headache.  Topiramate is used to treat cluster headache.  At this point, he will just remain on the Depakote and melatonin for now

## 2017-07-06 NOTE — Telephone Encounter (Signed)
[facility] needs [facility] at [phone]

## 2017-07-07 NOTE — Telephone Encounter (Signed)
Spoke with patient and patient is aware of situation and is in process now in getting another pharmacy program so that he may be able to get his Topamax through.

## 2017-09-22 ENCOUNTER — Other Ambulatory Visit (INDEPENDENT_AMBULATORY_CARE_PROVIDER_SITE_OTHER): Payer: Self-pay

## 2017-09-22 ENCOUNTER — Encounter: Payer: Self-pay | Admitting: Neurology

## 2017-09-22 ENCOUNTER — Ambulatory Visit (INDEPENDENT_AMBULATORY_CARE_PROVIDER_SITE_OTHER): Payer: Self-pay | Admitting: Neurology

## 2017-09-22 VITALS — BP 108/80 | HR 74 | Ht 72.0 in | Wt 187.0 lb

## 2017-09-22 DIAGNOSIS — G44019 Episodic cluster headache, not intractable: Secondary | ICD-10-CM

## 2017-09-22 DIAGNOSIS — Z79899 Other long term (current) drug therapy: Secondary | ICD-10-CM

## 2017-09-22 LAB — COMPREHENSIVE METABOLIC PANEL
ALK PHOS: 61 U/L (ref 39–117)
ALT: 10 U/L (ref 0–53)
AST: 13 U/L (ref 0–37)
Albumin: 4.3 g/dL (ref 3.5–5.2)
BUN: 14 mg/dL (ref 6–23)
CHLORIDE: 105 meq/L (ref 96–112)
CO2: 30 meq/L (ref 19–32)
Calcium: 9.5 mg/dL (ref 8.4–10.5)
Creatinine, Ser: 1.08 mg/dL (ref 0.40–1.50)
GFR: 98.45 mL/min (ref >60.00)
GLUCOSE: 97 mg/dL (ref 70–99)
POTASSIUM: 4 meq/L (ref 3.5–5.1)
Sodium: 141 mEq/L (ref 135–145)
Total Bilirubin: 0.3 mg/dL (ref 0.2–1.2)
Total Protein: 7.5 g/dL (ref 6.0–8.3)

## 2017-09-22 LAB — CBC
HCT: 41.6 % (ref 39.0–52.0)
HEMOGLOBIN: 13.6 g/dL (ref 13.0–17.0)
MCHC: 32.8 g/dL (ref 30.0–36.0)
MCV: 89.9 fl (ref 78.0–100.0)
PLATELETS: 259 10*3/uL (ref 150.0–400.0)
RBC: 4.62 Mil/uL (ref 4.22–5.81)
RDW: 13.8 % (ref 11.5–15.5)
WBC: 7 10*3/uL (ref 4.0–10.5)

## 2017-09-22 LAB — TSH: TSH: 1.33 u[IU]/mL (ref 0.35–4.50)

## 2017-09-22 NOTE — Progress Notes (Signed)
NEUROLOGY FOLLOW UP OFFICE NOTE  Kenneth [name] [id number]  HISTORY OF PRESENT ILLNESS: Kenneth [name] is a 37 year old right- handed man who is HIV positive and cigarette smoker, who follows up for episodic cluster headaches.   UPDATE: Last visit, we discontinued verapamil and discussed starting Lithium, however it is not covered by [facility].  Topiramate was no longer covered as well.  Instead, he was started on Depakote in February.  CBC demonstrated WBC 7.7, HGB 13.3, HCT 40.0 and PLT 247; hepatic panel demonstrated t bili 0.3, ALP 66, AST 21 and ALT 21; TSH was 1.60 and free T4 was 0.70.  Normally, he would have started another cluster now.  So far, he has been headache-free.  Depakote makes him sleepy, so he takes it at night.  Current NSAIDS:  no Current analgesics:  no Current triptans:  Sumatriptan  NS Current anti-emetic:  no Current muscle relaxants:  no Current anti-anxiolytic:  no Current sleep aide:  melatonin Current Antihypertensive medications:  no Current Antidepressant medications:  no Current Anticonvulsant medications: Depakote ER  at bedtime Other therapy:  O2 Other medication:  Atripla   Depression/anxiety:  stable.   HISTORY: Onset:  2004-2005 Location:  right frontal/retro-orbital Quality:  excruciating Intensity:  10/10 Aura:  no Prodrome:  no Associated symptoms:  Right eye conjunctival injection, lacrimation and droopy eyelid Duration:  30 to 40 minutes  Frequency:  Occurs in clusters during the warm months from around April-May until September.  They occur 2-3 times a day Triggers/exacerbating factors:  no Relieving factors:  no Activity:  cannot function during attack   Past NSAIDS:  multiple Past analgesics:  Fioricet, Percocet Past abortive triptans:  sumatriptan  NS Past antihypertensive medications:  verapamil  CR (higher doses ineffective) Past antidepressant medications:  no Past anticonvulsant medications:    topiramate  twice daily (higher doses ineffective) Past vitamins/Herbal/Supplements:  no Past antihistamines/decongestants:  no Other past therapy:  Prednisone during cluster   Alcohol:  rarely Smoker:  no   He had an MRI of the brain with and without contrast on 10/10/06 to evaluate headache, which was personally reviewed and revealed and is unremarkable.  PAST MEDICAL HISTORY: Past Medical History:  Diagnosis Date  . Headaches, cluster   . HIV infection [facility])     MEDICATIONS: Current Outpatient Medications on File Prior to Visit  Medication Sig Dispense Refill  . divalproex (DEPAKOTE ER) 500 MG 24 hr tablet Take 1 tablet (500 mg total) by mouth at bedtime. 90 tablet 1  . efavirenz-emtricitabine-tenofovir (ATRIPLA) 600-200-300 MG tablet Take 1 tablet by mouth at bedtime. 90 tablet 2  . Melatonin 200 MCG TABS Take 1 tablet by mouth at bedtime as needed.    Marland Kitchen omeprazole (PRILOSEC OTC) 20 MG tablet Take 20 mg by mouth daily as needed.     . SUMAtriptan (IMITREX) 5 MG/ACT nasal spray One spray in each nostril once. If no relief may repeat in 2 hours 18 Inhaler 3  . topiramate (TOPAMAX) 100 MG tablet Take 1 tablet (100 mg total) by mouth 2 (two) times daily. 180 tablet 1  . vitamin B-12 (CYANOCOBALAMIN) 250 MCG tablet Take 250 mcg by mouth daily.    . vitamin C (ASCORBIC ACID) 500 MG tablet Take 500 mg by mouth daily.    Marland Kitchen ZYCLARA 3.75 % CREA APPLY TOPICALLY TO THE AFFECTED AREA EVERY OTHER DAY. LEAVE ON FOR 6 HOURS THEN WASH OFF 28 each 0  . ZYCLARA PUMP 2.5 % CREA  APPLY TOPICALLY EVERY OTHER DAY AND LEAVE ON FOR 6 HOURS THEN WASH OFF 7.5 g 0   No current facility-administered medications on file prior to visit.     ALLERGIES: No Known Allergies  FAMILY HISTORY: Family History  Problem Relation Age of Onset  . Stroke Mother   . Heart disease Father     SOCIAL HISTORY: Social History   Socioeconomic History  . Marital status: Single    Spouse name: Not on file  .  Number of children: Not on file  . Years of education: Not on file  . Highest education level: Not on file  Occupational History  . Not on file  Social Needs  . Financial resource strain: Not on file  . Food insecurity:    Worry: Not on file    Inability: Not on file  . Transportation needs:    Medical: Not on file    Non-medical: Not on file  Tobacco Use  . Smoking status: Former Smoker    Years: 1.00    Types: Cigarettes    Start date: 09/25/2011  . Smokeless tobacco: Never Used  Substance and Sexual Activity  . Alcohol use: Yes    Alcohol/week: 1.8 oz    Types: 3 Glasses of wine per week    Comment: rarely  . Drug use: No  . Sexual activity: Yes    Partners: Male    Birth control/protection: Condom    Comment: pt. declined condoms  Lifestyle  . Physical activity:    Days per week: Not on file    Minutes per session: Not on file  . Stress: Not on file  Relationships  . Social connections:    Talks on phone: Not on file    Gets together: Not on file    Attends religious service: Not on file    Active member of club or organization: Not on file    Attends meetings of clubs or organizations: Not on file    Relationship status: Not on file  . Intimate partner violence:    Fear of current or ex partner: Not on file    Emotionally abused: Not on file    Physically abused: Not on file    Forced sexual activity: Not on file  Other Topics Concern  . Not on file  Social History Narrative  . Not on file    REVIEW OF SYSTEMS: Constitutional: No fevers, chills, or sweats, no generalized fatigue, change in appetite Eyes: No visual changes, double vision, eye pain Ear, nose and throat: No hearing loss, ear pain, nasal congestion, sore throat Cardiovascular: No chest pain, palpitations Respiratory:  No shortness of breath at rest or with exertion, wheezes GastrointestinaI: No nausea, vomiting, diarrhea, abdominal pain, fecal incontinence Genitourinary:  No dysuria, urinary  retention or frequency Musculoskeletal:  No neck pain, back pain Integumentary: No rash, pruritus, skin lesions Neurological: as above Psychiatric: No depression, insomnia, anxiety Endocrine: No palpitations, fatigue, diaphoresis, mood swings, change in appetite, change in weight, increased thirst Hematologic/Lymphatic:  No purpura, petechiae. Allergic/Immunologic: no itchy/runny eyes, nasal congestion, recent allergic reactions, rashes  PHYSICAL EXAM: Vitals:   09/22/17 1117  BP: 108/80  Pulse: 74  SpO2: 99%   General: No acute distress.  Patient appears well-groomed.  normal body habitus. Head:  Normocephalic/atraumatic Eyes:  Fundi examined but not visualized Neck: supple, no paraspinal tenderness, full range of motion Heart:  Regular rate and rhythm Lungs:  Clear to auscultation bilaterally Back: No paraspinal tenderness Neurological Exam: alert  and oriented to person, place, and time. Attention span and concentration intact, recent and remote memory intact, fund of knowledge intact.  Speech fluent and not dysarthric, language intact.  CN II-XII intact. Bulk and tone normal, muscle strength 5/5 throughout.  Sensation to light touch  intact.  Deep tendon reflexes 2+ throughout, toes downgoing.  Finger to nose testing intact.  Gait normal, Romberg negative.   IMPRESSION: Episodic cluster headache.  This is typically the time of year that he has a cluster.  He has been headache-free thus far.    PLAN: 1.  Continue Depakote ER  at bedtime.  Check CBC, CMP and TSH.  Recheck again in 3 months.  If he has another cluster, contact me and we can either increase depakote or add topiramate. 2.  Follow up in 3 months.  If he is doing well, he may cancel and extend follow up for another 3 months.  Shon Millet, DO  CC: Johny Sax, MD

## 2017-09-22 NOTE — Patient Instructions (Signed)
1.  Continue Depakote ER  at bedtime.  Check CBC, CMP and TSH.  Recheck again in 3 months.  If you get another cluster, contact me and we can either increase depakote or add topiramate. 2.  Follow up in 3 months.  If still doing well, you may reschedule for in another 3 months.

## 2017-10-18 ENCOUNTER — Ambulatory Visit: Payer: Self-pay | Admitting: Neurology

## 2017-10-24 ENCOUNTER — Telehealth: Payer: Self-pay | Admitting: Neurology

## 2017-10-24 ENCOUNTER — Other Ambulatory Visit: Payer: Self-pay

## 2017-10-24 ENCOUNTER — Ambulatory Visit: Payer: Self-pay

## 2017-10-24 DIAGNOSIS — Z79899 Other long term (current) drug therapy: Secondary | ICD-10-CM

## 2017-10-24 DIAGNOSIS — B20 Human immunodeficiency virus [HIV] disease: Secondary | ICD-10-CM

## 2017-10-24 DIAGNOSIS — Z113 Encounter for screening for infections with a predominantly sexual mode of transmission: Secondary | ICD-10-CM

## 2017-10-24 NOTE — Telephone Encounter (Signed)
Called and spoke with Pt. I advised him labs were to be repeated in 3 months, it has only been 2. I reminded him his appt in August is 8/27, and to come the week prior for labs.

## 2017-10-25 LAB — CBC
HEMATOCRIT: 39.5 % (ref 38.5–50.0)
Hemoglobin: 13.2 g/dL (ref 13.2–17.1)
MCH: 29.5 pg (ref 27.0–33.0)
MCHC: 33.4 g/dL (ref 32.0–36.0)
MCV: 88.2 fL (ref 80.0–100.0)
MPV: 10.8 fL (ref 7.5–12.5)
PLATELETS: 244 10*3/uL (ref 140–400)
RBC: 4.48 10*6/uL (ref 4.20–5.80)
RDW: 13.1 % (ref 11.0–15.0)
WBC: 8.7 10*3/uL (ref 3.8–10.8)

## 2017-10-25 LAB — COMPREHENSIVE METABOLIC PANEL
AG Ratio: 1.7 (calc) (ref 1.0–2.5)
ALBUMIN MSPROF: 4.5 g/dL (ref 3.6–5.1)
ALT: 12 U/L (ref 9–46)
AST: 14 U/L (ref 10–40)
Alkaline phosphatase (APISO): 73 U/L (ref 40–115)
BILIRUBIN TOTAL: 0.4 mg/dL (ref 0.2–1.2)
BUN: 11 mg/dL (ref 7–25)
CALCIUM: 9.2 mg/dL (ref 8.6–10.3)
CO2: 26 mmol/L (ref 20–32)
CREATININE: 1.13 mg/dL (ref 0.60–1.35)
Chloride: 106 mmol/L (ref 98–110)
GLUCOSE: 65 mg/dL (ref 65–99)
Globulin: 2.7 g/dL (calc) (ref 1.9–3.7)
POTASSIUM: 4.2 mmol/L (ref 3.5–5.3)
Sodium: 143 mmol/L (ref 135–146)
TOTAL PROTEIN: 7.2 g/dL (ref 6.1–8.1)

## 2017-10-25 LAB — LIPID PANEL
CHOL/HDL RATIO: 3.7 (calc) (ref <5.0)
Cholesterol: 175 mg/dL (ref <200)
HDL: 47 mg/dL (ref >40)
LDL CHOLESTEROL (CALC): 106 mg/dL — AB
NON-HDL CHOLESTEROL (CALC): 128 mg/dL (ref <130)
Triglycerides: 128 mg/dL (ref <150)

## 2017-10-25 LAB — T-HELPER CELL (CD4) - (RCID CLINIC ONLY)
CD4 % Helper T Cell: 39 % (ref 33–55)
CD4 T CELL ABS: 680 /uL (ref 400–2700)

## 2017-10-25 LAB — RPR: RPR Ser Ql: NONREACTIVE

## 2017-10-26 LAB — HIV-1 RNA QUANT-NO REFLEX-BLD
HIV 1 RNA QUANT: NOT DETECTED {copies}/mL
HIV-1 RNA Quant, Log: <1.3 Log copies/mL

## 2017-10-28 ENCOUNTER — Encounter: Payer: Self-pay | Admitting: Infectious Diseases

## 2017-11-07 ENCOUNTER — Ambulatory Visit: Payer: Self-pay | Admitting: Infectious Diseases

## 2017-11-16 ENCOUNTER — Ambulatory Visit: Payer: Self-pay | Admitting: Infectious Diseases

## 2017-11-16 ENCOUNTER — Ambulatory Visit (INDEPENDENT_AMBULATORY_CARE_PROVIDER_SITE_OTHER): Payer: Self-pay | Admitting: Infectious Diseases

## 2017-11-16 ENCOUNTER — Encounter: Payer: Self-pay | Admitting: Infectious Diseases

## 2017-11-16 VITALS — BP 124/90 | HR 79 | Temp 98.5°F | Wt 182.0 lb

## 2017-11-16 DIAGNOSIS — Z23 Encounter for immunization: Secondary | ICD-10-CM

## 2017-11-16 DIAGNOSIS — A630 Anogenital (venereal) warts: Secondary | ICD-10-CM

## 2017-11-16 DIAGNOSIS — B20 Human immunodeficiency virus [HIV] disease: Secondary | ICD-10-CM

## 2017-11-16 DIAGNOSIS — G43809 Other migraine, not intractable, without status migrainosus: Secondary | ICD-10-CM

## 2017-11-16 MED ORDER — EMTRICITAB-RILPIVIR-TENOFOV AF 200-25-25 MG PO TABS: 1.0000 | ORAL_TABLET | Freq: Every day | ORAL | 3 refills | Status: DC

## 2017-11-16 NOTE — Progress Notes (Signed)
   Subjective:    Patient ID: Kenneth ConnersJames L [name], male    DOB: [birth year], 37 y.o.   MRN: [id number]  HPI 37 yo M with hx of HIV+ (Dx 2006?), tension/cluster headaches.Was prev seen by neuro and given intranasal imitrex, topomax and verapamil po. Has been headache free recently.  He is currently on atripla, would like to change. His partner is on odefsy, he would like to be on this as well. He is aware of food requirements.   HIV 1 RNA Quant (copies/mL)  Date Value  10/24/2017 <20 NOT DETECTED  01/18/2017 <20 NOT DETECTED  07/08/2016 <20 NOT DETECTED   CD4 T Cell Abs (/uL)  Date Value  10/24/2017 680  01/18/2017 670  07/08/2016 690     Review of Systems  Constitutional: Negative for appetite change and unexpected weight change.  Gastrointestinal: Positive for anal bleeding and rectal pain. Negative for constipation and diarrhea.  Genitourinary: Negative for difficulty urinating.  Neurological: Negative for headaches.  was prev using imiqomod cream but ran out.  Please see HPI. All other systems reviewed and negative.      Objective:   Physical Exam  Constitutional: He appears well-developed and well-nourished.  HENT:  Mouth/Throat: No oropharyngeal exudate.  Eyes: Pupils are equal, round, and reactive to light. EOM are normal.  Neck: Normal range of motion. Neck supple.  Cardiovascular: Normal rate, regular rhythm and normal heart sounds.  Pulmonary/Chest: Effort normal and breath sounds normal.  Abdominal: Soft. Bowel sounds are normal. There is no tenderness. There is no guarding.  Few anal warts.   Musculoskeletal: He exhibits no edema.  Lymphadenopathy:    He has no cervical adenopathy.  Psychiatric: He has a normal mood and affect.       Assessment & Plan:

## 2017-11-16 NOTE — Assessment & Plan Note (Signed)
He is doing well Will change him to odefsy at his request.  Take with food, space 12 h from prilosec.  rtc in 4 months PCV today Offered/refused condoms.

## 2017-11-16 NOTE — Addendum Note (Signed)
Addended by: Lorenso CourierMALDONADO, Isella Slatten L on: 11/16/2017 11:33 AM   Modules accepted: Orders

## 2017-11-16 NOTE — Assessment & Plan Note (Signed)
Improved, will continue to monitor 

## 2017-11-16 NOTE — Assessment & Plan Note (Signed)
Will have him seen by surgery for HRA ect

## 2017-12-19 NOTE — Progress Notes (Deleted)
NEUROLOGY FOLLOW UP OFFICE NOTE  Kenneth [name] [id number]  HISTORY OF PRESENT ILLNESS: Kenneth [name] is a 37 year old right-handed male who is HIV positive and cigarette smoker who follows up for episodic cluster headache.  UPDATE: Intensity:  *** Duration:  *** Frequency:  *** Frequency of abortive medication: *** Current NSAIDS:  *** Current analgesics:  *** Current triptans:  *** Current ergotamine:  *** Current anti-emetic:  *** Current muscle relaxants:  *** Current anti-anxiolytic:  *** Current sleep aide:  *** Current Antihypertensive medications:  *** Current Antidepressant medications:  *** Current Anticonvulsant medications:  Depakote ER 500mg  Current anti-CGRP:  *** Current Vitamins/Herbal/Supplements:  *** Current Antihistamines/Decongestants:  *** Other therapy:  *** Other medication:  ***  Caffeine:  *** Alcohol:  *** Smoker:  *** Diet:  *** Exercise:  *** Depression:  ***; Anxiety:  *** Other pain:  *** Sleep hygiene:  ***  HISTORY: Onset: 2004-2005 Location:  right frontal/retro-orbital Quality:  excruciating Intensity:  10/10 Aura:  no Prodrome:  no Associated symptoms:  Right eye conjunctival injection, lacrimation and droopy eyelid Duration:  30 to 40 minutes  Frequency:  Occurs in clusters during the warm months from around April-May until September.  They occur 2-3 times a day Triggers/aggravating factors:  No Relieving factors:  No Activity:  cannot function during attack  Past NSAIDS:  multiple Past analgesics:  Fioricet, Percocet Past abortive triptans:  sumatriptan 5mg  NS Past antihypertensive medications:  verapamil 180mg  CR (higher doses ineffective) Past antidepressant medications:  no Past anticonvulsant medications:   topiramate 100mg  twice daily (higher doses ineffective) Past vitamins/Herbal/Supplements:  no Past antihistamines/decongestants:  no Other past therapy:  Prednisone during cluster  Alcohol:   rarely Smoker:  no  He had an MRI of the brain with and without contrast on 10/10/06 to evaluate headache, which was personally reviewed and revealed and is unremarkable.  PAST MEDICAL HISTORY: Past Medical History:  Diagnosis Date  . Headaches, cluster   . HIV infection [facility])     MEDICATIONS: Current Outpatient Medications on File Prior to Visit  Medication Sig Dispense Refill  . divalproex (DEPAKOTE ER) 500 MG 24 hr tablet Take 1 tablet (500 mg total) by mouth at bedtime. 90 tablet 1  . emtricitabine-rilpivir-tenofovir AF (ODEFSEY) 200-25-25 MG TABS tablet Take 1 tablet by mouth daily with breakfast. 90 tablet 3  . Melatonin 200 MCG TABS Take 1 tablet by mouth at bedtime as needed.    Marland Kitchen. omeprazole (PRILOSEC OTC) 20 MG tablet Take 20 mg by mouth daily as needed.     . SUMAtriptan (IMITREX) 5 MG/ACT nasal spray One spray in each nostril once. If no relief may repeat in 2 hours 18 Inhaler 3  . topiramate (TOPAMAX) 100 MG tablet Take 1 tablet (100 mg total) by mouth 2 (two) times daily. 180 tablet 1  . vitamin B-12 (CYANOCOBALAMIN) 250 MCG tablet Take 250 mcg by mouth daily.    . vitamin C (ASCORBIC ACID) 500 MG tablet Take 500 mg by mouth daily.    Marland Kitchen. ZYCLARA 3.75 % CREA APPLY TOPICALLY TO THE AFFECTED AREA EVERY OTHER DAY. LEAVE ON FOR 6 HOURS THEN WASH OFF 28 each 0  . ZYCLARA PUMP 2.5 % CREA APPLY TOPICALLY EVERY OTHER DAY AND LEAVE ON FOR 6 HOURS THEN WASH OFF 7.5 g 0   No current facility-administered medications on file prior to visit.     ALLERGIES: No Known Allergies  FAMILY HISTORY: Family History  Problem Relation Age of Onset  .  Stroke Mother   . Heart disease Father    ***.  SOCIAL HISTORY: Social History   Socioeconomic History  . Marital status: Single    Spouse name: Not on file  . Number of children: Not on file  . Years of education: Not on file  . Highest education level: Not on file  Occupational History  . Not on file  Social Needs  . Financial  resource strain: Not on file  . Food insecurity:    Worry: Not on file    Inability: Not on file  . Transportation needs:    Medical: Not on file    Non-medical: Not on file  Tobacco Use  . Smoking status: Former Smoker    Years: 1.00    Types: Cigarettes    Start date: 09/25/2011  . Smokeless tobacco: Never Used  Substance and Sexual Activity  . Alcohol use: Yes    Alcohol/week: 3.0 standard drinks    Types: 3 Glasses of wine per week    Comment: rarely  . Drug use: No  . Sexual activity: Yes    Partners: Male    Birth control/protection: Condom    Comment: pt. declined condoms  Lifestyle  . Physical activity:    Days per week: Not on file    Minutes per session: Not on file  . Stress: Not on file  Relationships  . Social connections:    Talks on phone: Not on file    Gets together: Not on file    Attends religious service: Not on file    Active member of club or organization: Not on file    Attends meetings of clubs or organizations: Not on file    Relationship status: Not on file  . Intimate partner violence:    Fear of current or ex partner: Not on file    Emotionally abused: Not on file    Physically abused: Not on file    Forced sexual activity: Not on file  Other Topics Concern  . Not on file  Social History Narrative  . Not on file    REVIEW OF SYSTEMS: Constitutional: No fevers, chills, or sweats, no generalized fatigue, change in appetite Eyes: No visual changes, double vision, eye pain Ear, nose and throat: No hearing loss, ear pain, nasal congestion, sore throat Cardiovascular: No chest pain, palpitations Respiratory:  No shortness of breath at rest or with exertion, wheezes GastrointestinaI: No nausea, vomiting, diarrhea, abdominal pain, fecal incontinence Genitourinary:  No dysuria, urinary retention or frequency Musculoskeletal:  No neck pain, back pain Integumentary: No rash, pruritus, skin lesions Neurological: as above Psychiatric: No  depression, insomnia, anxiety Endocrine: No palpitations, fatigue, diaphoresis, mood swings, change in appetite, change in weight, increased thirst Hematologic/Lymphatic:  No purpura, petechiae. Allergic/Immunologic: no itchy/runny eyes, nasal congestion, recent allergic reactions, rashes  PHYSICAL EXAM: *** General: No acute distress.  Patient appears ***-groomed.  *** body habitus. Head:  Normocephalic/atraumatic Eyes:  Fundi examined but not visualized Neck: supple, no paraspinal tenderness, full range of motion Heart:  Regular rate and rhythm Lungs:  Clear to auscultation bilaterally Back: No paraspinal tenderness Neurological Exam: alert and oriented to person, place, and time. Attention span and concentration intact, recent and remote memory intact, fund of knowledge intact.  Speech fluent and not dysarthric, language intact.  CN II-XII intact. Bulk and tone normal, muscle strength 5/5 throughout.  Sensation to light touch, temperature and vibration intact.  Deep tendon reflexes 2+ throughout, toes downgoing.  Finger to nose  and heel to shin testing intact.  Gait normal, Romberg negative.  IMPRESSION: ***  PLAN: ***  Shon Millet, DO  CC: Johny Sax, MD

## 2017-12-20 ENCOUNTER — Ambulatory Visit: Payer: Self-pay | Admitting: Neurology

## 2018-01-30 ENCOUNTER — Other Ambulatory Visit: Payer: Self-pay

## 2018-02-13 ENCOUNTER — Encounter: Payer: Self-pay | Admitting: Infectious Diseases

## 2018-03-01 ENCOUNTER — Telehealth: Payer: Self-pay

## 2018-03-01 NOTE — Telephone Encounter (Signed)
Attempted to call patient regarding schedule changes at our office. Patient will need to reschedule any future appointments with another provider in our office. Dr. Ninetta Lights has received a promotion and is reassigning patients. Left message for patient to call office back. [name], [place]

## 2018-03-03 ENCOUNTER — Other Ambulatory Visit: Payer: Self-pay | Admitting: Neurology

## 2018-03-06 ENCOUNTER — Telehealth: Payer: Self-pay | Admitting: Neurology

## 2018-03-06 NOTE — Telephone Encounter (Signed)
Patient called and needs to know if he can have his labs from our office taken tomorrow while having labs at [facility]? He would prefer 1 time instead of two lab appointments.  Please Advise. Thanks

## 2018-03-06 NOTE — Telephone Encounter (Signed)
I called Pt and advised him since Dr Ninetta Lights had ordered a CBC and CMP, he would only need to add a TSH, and that would be fine.

## 2018-03-07 ENCOUNTER — Other Ambulatory Visit: Payer: Self-pay

## 2018-03-07 DIAGNOSIS — Z113 Encounter for screening for infections with a predominantly sexual mode of transmission: Secondary | ICD-10-CM

## 2018-03-07 DIAGNOSIS — B20 Human immunodeficiency virus [HIV] disease: Secondary | ICD-10-CM

## 2018-03-07 DIAGNOSIS — Z79899 Other long term (current) drug therapy: Secondary | ICD-10-CM

## 2018-03-08 LAB — URINALYSIS
Bilirubin Urine: NEGATIVE
Glucose, UA: NEGATIVE
HGB URINE DIPSTICK: NEGATIVE
LEUKOCYTES UA: NEGATIVE
NITRITE: NEGATIVE
PH: 7.5 (ref 5.0–8.0)
PROTEIN: NEGATIVE
SPECIFIC GRAVITY, URINE: 1.026 (ref 1.001–1.03)

## 2018-03-08 LAB — T-HELPER CELL (CD4) - (RCID CLINIC ONLY)
CD4 % Helper T Cell: 44 % (ref 33–55)
CD4 T Cell Abs: 840 /uL (ref 400–2700)

## 2018-03-08 LAB — TSH: TSH: 0.66 mIU/L (ref 0.40–4.50)

## 2018-03-08 LAB — URINE CYTOLOGY ANCILLARY ONLY
CHLAMYDIA, DNA PROBE: NEGATIVE
Neisseria Gonorrhea: NEGATIVE

## 2018-03-09 LAB — CBC
HCT: 42.2 % (ref 38.5–50.0)
Hemoglobin: 14 g/dL (ref 13.2–17.1)
MCH: 28.8 pg (ref 27.0–33.0)
MCHC: 33.2 g/dL (ref 32.0–36.0)
MCV: 86.8 fL (ref 80.0–100.0)
MPV: 10.9 fL (ref 7.5–12.5)
PLATELETS: 287 10*3/uL (ref 140–400)
RBC: 4.86 10*6/uL (ref 4.20–5.80)
RDW: 13.1 % (ref 11.0–15.0)
WBC: 7.2 10*3/uL (ref 3.8–10.8)

## 2018-03-09 LAB — COMPLETE METABOLIC PANEL WITH GFR
AG Ratio: 1.5 (calc) (ref 1.0–2.5)
ALBUMIN MSPROF: 4.3 g/dL (ref 3.6–5.1)
ALT: 15 U/L (ref 9–46)
AST: 16 U/L (ref 10–40)
Alkaline phosphatase (APISO): 58 U/L (ref 40–115)
BUN: 12 mg/dL (ref 7–25)
CHLORIDE: 106 mmol/L (ref 98–110)
CO2: 27 mmol/L (ref 20–32)
Calcium: 9.8 mg/dL (ref 8.6–10.3)
Creat: 1.16 mg/dL (ref 0.60–1.35)
GFR, EST AFRICAN AMERICAN: 92 mL/min/{1.73_m2} (ref >=60)
GFR, EST NON AFRICAN AMERICAN: 79 mL/min/{1.73_m2} (ref >=60)
GLUCOSE: 80 mg/dL (ref 65–99)
Globulin: 2.9 g/dL (calc) (ref 1.9–3.7)
Potassium: 4.4 mmol/L (ref 3.5–5.3)
Sodium: 145 mmol/L (ref 135–146)
TOTAL PROTEIN: 7.2 g/dL (ref 6.1–8.1)
Total Bilirubin: 0.5 mg/dL (ref 0.2–1.2)

## 2018-03-09 LAB — HIV-1 RNA QUANT-NO REFLEX-BLD
HIV 1 RNA QUANT: NOT DETECTED {copies}/mL
HIV-1 RNA Quant, Log: <1.3 Log copies/mL

## 2018-03-22 ENCOUNTER — Encounter: Payer: Self-pay | Admitting: Family

## 2018-03-22 ENCOUNTER — Ambulatory Visit (INDEPENDENT_AMBULATORY_CARE_PROVIDER_SITE_OTHER): Payer: Self-pay | Admitting: Family

## 2018-03-22 VITALS — BP 117/81 | HR 84 | Temp 97.5°F | Wt 210.0 lb

## 2018-03-22 DIAGNOSIS — Z0000 Encounter for general adult medical examination without abnormal findings: Secondary | ICD-10-CM

## 2018-03-22 DIAGNOSIS — B20 Human immunodeficiency virus [HIV] disease: Secondary | ICD-10-CM

## 2018-03-22 DIAGNOSIS — G43809 Other migraine, not intractable, without status migrainosus: Secondary | ICD-10-CM

## 2018-03-22 MED ORDER — SUMATRIPTAN 5 MG/ACT NA SOLN: NASAL | 3 refills | Status: DC

## 2018-03-22 NOTE — Assessment & Plan Note (Signed)
Stable and no recent headaches with current regimen of Depakote. Has asked for medication assistance for Imitrex. [employer] patient assistance form provided for him to complete. Continue to follow up with neurology as scheduled.

## 2018-03-22 NOTE — Progress Notes (Signed)
Subjective:    Patient ID: Kenneth [name], male    DOB: Dec 04, 1980, 37 y.o.   MRN: [id number]  Chief Complaint  Patient presents with  . HIV Positive/AIDS     HPI:  Kenneth [name] is a 37 y.o. male who presents today for routine follow up of HIV disease.   Kenneth [name] was last seen in the office on 11/16/2017 for routine follow-up of HIV disease which was well controlled.  He was on a Atripla and then transition to [facility] per his request.  Blood work at the time showed a viral load that was undetectable with a CD4 count of 680.  Most recent blood work completed on 03/07/2018 shows a viral load that remains undetectable with a CD4 count of 840.  RPR was previously nonreactive on 10/24/2017; gonorrhea and Chlamydia were negative.  Kidney function, liver function, electrolytes within normal ranges.  Previous cholesterol with slight elevation of LDL at 106 with HDL of 47. ASCVD risk of 5%. Health maintenance due includes Menveo and influenza vaccinations and a dental screening.   Kenneth [name] has been taking his Odefsey as prescribed with no adverse side effects or missed doses.  He continues to receive his medications with no problems from [facility] pharmacy.  Has been taking his medication with a meal each day and is avoiding antacids.  Completed his last dental visit within the last 6 months here through our clinic. Headaches have been well controlled with Depakote.  Denies fevers, chills, night sweats, headaches, changes in vision, neck pain/stiffness, nausea, diarrhea, vomiting, lesions or rashes.   No Known Allergies    Outpatient Medications Prior to Visit  Medication Sig Dispense Refill  . divalproex (DEPAKOTE ER) 500 MG 24 hr tablet TAKE 1 TABLET(500 MG) BY MOUTH AT BEDTIME 30 tablet 0  . emtricitabine-rilpivir-tenofovir AF (ODEFSEY) 200-25-25 MG TABS tablet Take 1 tablet by mouth daily with breakfast. 90 tablet 3  . Melatonin 200 MCG TABS Take 1 tablet by mouth at bedtime as  needed.    Marland Kitchen omeprazole (PRILOSEC OTC) 20 MG tablet Take 20 mg by mouth daily as needed.     . SUMAtriptan (IMITREX) 5 MG/ACT nasal spray One spray in each nostril once. If no relief may repeat in 2 hours 18 Inhaler 3  . vitamin B-12 (CYANOCOBALAMIN) 250 MCG tablet Take 250 mcg by mouth daily.    . vitamin C (ASCORBIC ACID) 500 MG tablet Take 500 mg by mouth daily.    Marland Kitchen ZYCLARA 3.75 % CREA APPLY TOPICALLY TO THE AFFECTED AREA EVERY OTHER DAY. LEAVE ON FOR 6 HOURS THEN WASH OFF 28 each 0  . ZYCLARA PUMP 2.5 % CREA APPLY TOPICALLY EVERY OTHER DAY AND LEAVE ON FOR 6 HOURS THEN WASH OFF (Patient not taking: Reported on 03/22/2018) 7.5 g 0  . topiramate (TOPAMAX) 100 MG tablet Take 1 tablet (100 mg total) by mouth 2 (two) times daily. (Patient not taking: Reported on 03/22/2018) 180 tablet 1   No facility-administered medications prior to visit.      Past Medical History:  Diagnosis Date  . Headaches, cluster   . HIV infection (HCC)      History reviewed. No pertinent surgical history.     Review of Systems  Constitutional: Negative for appetite change, chills, fatigue, fever and unexpected weight change.  Eyes: Negative for visual disturbance.  Respiratory: Negative for cough, chest tightness, shortness of breath and wheezing.   Cardiovascular: Negative for chest pain and leg swelling.  Gastrointestinal: Negative  for abdominal pain, constipation, diarrhea, nausea and vomiting.  Genitourinary: Negative for dysuria, flank pain, frequency, genital sores, hematuria and urgency.  Skin: Negative for rash.  Allergic/Immunologic: Negative for immunocompromised state.  Neurological: Negative for dizziness and headaches.      Objective:    BP 117/81   Pulse 84   Temp (!) 97.5 F (36.4 C) (Oral)   Wt 210 lb (95.3 kg)   BMI 28.48 kg/m  Nursing note and vital signs reviewed.  Physical Exam  Constitutional: He is oriented to person, place, and time. He appears well-developed. No  distress.  HENT:  Mouth/Throat: Oropharynx is clear and moist.  Eyes: Conjunctivae are normal.  Neck: Neck supple.  Cardiovascular: Normal rate, regular rhythm, normal heart sounds and intact distal pulses. Exam reveals no gallop and no friction rub.  No murmur heard. Pulmonary/Chest: Effort normal and breath sounds normal. No respiratory distress. He has no wheezes. He has no rales. He exhibits no tenderness.  Abdominal: Soft. Bowel sounds are normal. There is no tenderness.  Lymphadenopathy:    He has no cervical adenopathy.  Neurological: He is alert and oriented to person, place, and time.  Skin: Skin is warm and dry. No rash noted.  Psychiatric: He has a normal mood and affect. His behavior is normal. Judgment and thought content normal.       Assessment & Plan:   Problem List Items Addressed This Visit      Cardiovascular and Mediastinum   Migraine variant    Stable and no recent headaches with current regimen of Depakote. Has asked for medication assistance for Imitrex. [employer] patient assistance form provided for him to complete. Continue to follow up with neurology as scheduled.         Other   Human immunodeficiency virus (HIV) disease (HCC) - Primary    Mr. Kenneth [name] has well controlled HIV diease with his current ART regimen of Odefsey. He is adherent with no missed doses and has no problems obtaining his medications. No signs/symptoms of opportunistic infection or progressive HIV disease. Emphasized importance of taking Odefsey with a meal and avoiding antacids. Continue current dose of Odefsey. Plan for follow up in 6 months or sooner if needed with lab work 1-2 weeks prior to appointment.       Relevant Orders   T-helper cell (CD4)- ([facility] clinic only)   HIV-1 RNA quant-no reflex-bld   CBC   Comprehensive metabolic panel   Lipid panel   RPR   Urine cytology ancillary only([place])   Cytology (oral, anal, urethral) ancillary only   Healthcare maintenance      Menveo updated today. Influenza vaccination previously received.  Dental screening is up to date through [facility] clinic within the last 6 months.          I have discontinued Darril L. Penland's topiramate. I am also having him maintain his omeprazole, vitamin C, Melatonin, vitamin B-12, ZYCLARA PUMP, ZYCLARA, SUMAtriptan, emtricitabine-rilpivir-tenofovir AF, and divalproex.    Follow-up: Return in about 6 months (around 09/20/2018), or if symptoms worsen or fail to improve.   Marcos EkeGreg Kilynn Fitzsimmons, MSN, FNP-C Nurse Practitioner [facility] Office phone: [phone] Pager: [phone] [facility] Main number: [phone]

## 2018-03-22 NOTE — Assessment & Plan Note (Signed)
   Menveo updated today. Influenza vaccination previously received.  Dental screening is up to date through [facility] clinic within the last 6 months.

## 2018-03-22 NOTE — Addendum Note (Signed)
Addended by: Jeanine LuzALONE, GREGORY D on: 03/22/2018 05:16 PM   Modules accepted: Orders

## 2018-03-22 NOTE — Assessment & Plan Note (Signed)
Mr. Kenneth [name] has well controlled HIV diease with his current ART regimen of Odefsey. He is adherent with no missed doses and has no problems obtaining his medications. No signs/symptoms of opportunistic infection or progressive HIV disease. Emphasized importance of taking Odefsey with a meal and avoiding antacids. Continue current dose of Odefsey. Plan for follow up in 6 months or sooner if needed with lab work 1-2 weeks prior to appointment.

## 2018-03-22 NOTE — Patient Instructions (Signed)
Nice to meet you.  Please continue to take your [facility] as prescribed with food and stay away from antacids.   Follow up in 6 months or sooner if needed with lab work 1-2 week prior to your appointment.  **Don't forget to renew your HMAP/ADAP after January 1st.**  HAPPY HOLIDAYS!

## 2018-03-30 ENCOUNTER — Other Ambulatory Visit: Payer: Self-pay | Admitting: Neurology

## 2018-04-02 ENCOUNTER — Other Ambulatory Visit: Payer: Self-pay

## 2018-04-02 ENCOUNTER — Emergency Department (HOSPITAL_COMMUNITY)
Admission: EM | Admit: 2018-04-02 | Discharge: 2018-04-02 | Disposition: A | Discharge status: Home / Self Care | Payer: No Typology Code available for payment source | Attending: Emergency Medicine | Admitting: Emergency Medicine

## 2018-04-02 ENCOUNTER — Emergency Department (HOSPITAL_COMMUNITY): Payer: No Typology Code available for payment source

## 2018-04-02 ENCOUNTER — Encounter (HOSPITAL_COMMUNITY): Payer: Self-pay | Admitting: Emergency Medicine

## 2018-04-02 DIAGNOSIS — Z79899 Other long term (current) drug therapy: Secondary | ICD-10-CM | POA: Insufficient documentation

## 2018-04-02 DIAGNOSIS — M545 Low back pain, unspecified: Secondary | ICD-10-CM

## 2018-04-02 DIAGNOSIS — Z87891 Personal history of nicotine dependence: Secondary | ICD-10-CM | POA: Insufficient documentation

## 2018-04-02 DIAGNOSIS — B20 Human immunodeficiency virus [HIV] disease: Secondary | ICD-10-CM | POA: Diagnosis not present

## 2018-04-02 MED ORDER — CYCLOBENZAPRINE HCL 10 MG PO TABS
10.0000 mg | ORAL_TABLET | Freq: Once | ORAL | Status: AC
  Administered 2018-04-02: 10 mg via ORAL
  Filled 2018-04-02: qty 1

## 2018-04-02 MED ORDER — CYCLOBENZAPRINE HCL 10 MG PO TABS: 10.0000 mg | ORAL_TABLET | Freq: Two times a day (BID) | ORAL | 0 refills | Status: DC | PRN
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
## 2018-04-02 NOTE — Discharge Instructions (Signed)
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
## 2018-04-06 ENCOUNTER — Telehealth: Payer: Self-pay

## 2018-04-06 NOTE — Telephone Encounter (Signed)
Received fax from pharmacy for prescription clarification for Imitrex 5 mg nasal spray. Pharmacy needs clarification on dose for 24 hrs. Will route message to Marcos EkeGreg Calone, Np. Lorenso CourierJose L Kathyann Spaugh, [place]

## 2018-04-06 NOTE — Telephone Encounter (Signed)
Maximum dose is 30 mg per day. Thanks.

## 2018-04-06 NOTE — Telephone Encounter (Signed)
Information relayed to pharmacy. Pharmacist was able to take clarification for max dose.  Lorenso CourierJose L Luian Schumpert, [place]

## 2018-06-14 ENCOUNTER — Encounter: Payer: Self-pay | Admitting: Neurology

## 2018-06-22 ENCOUNTER — Encounter: Payer: Self-pay | Admitting: Infectious Diseases

## 2018-06-22 ENCOUNTER — Ambulatory Visit: Payer: Self-pay

## 2018-07-17 ENCOUNTER — Ambulatory Visit: Payer: Self-pay | Admitting: Neurology

## 2018-07-17 ENCOUNTER — Other Ambulatory Visit: Payer: Self-pay | Admitting: Neurology

## 2018-07-19 ENCOUNTER — Ambulatory Visit: Payer: Self-pay | Admitting: Neurology

## 2018-08-07 NOTE — Progress Notes (Signed)
Virtual Visit via Video Note The purpose of this virtual visit is to provide medical care while limiting exposure to the novel coronavirus.    Consent was obtained for video visit:  yes Answered questions that patient had about telehealth interaction:  yes I discussed the limitations, risks, security and privacy concerns of performing an evaluation and management service by telemedicine. I also discussed with the patient that there may be a patient responsible charge related to this service. The patient expressed understanding and agreed to proceed.  Pt location: Home Physician Location: office Name of referring provider:  Ginnie SmartHatcher, Jeffrey C, MD I connected with Kenneth [name] at patients initiation/request on 08/08/2018 at  2:00 PM EDT by video enabled telemedicine application and verified that I am speaking with the correct person using two identifiers. Pt MRN:  [id number] Pt DOB:  [birth year] Video Participants:  Kenneth [name]   History of Present Illness:  Kenneth [name] is a 38 year old right-handed man with HIV who follows up for cluster headaches.  UPDATE: Cluster restarted in mid-March.  They are 10/10, last 30 to 40 minutes and are daily. Current NSAIDS:  no Current analgesics:  no Current triptans:  Sumatriptan 5mg  NS Current anti-emetic:  no Current muscle relaxants:  no Current anti-anxiolytic:  no Current sleep aide:  melatonin 200mcg Current Antihypertensive medications:  no Current Antidepressant medications:  no Current Anticonvulsant medications: Depakote ER 500mg  at bedtime Other therapy:  O2 Other medication:  Atripla  Depression/anxiety:  stable.  03/07/18 LABS:  CBC with WBC 7.2, HGB 14, HCT 42.2, PLT 287; TSH 0.66; CMP with Na 145, K 4.4, Cl 106, CO2 27, glucose 80, BUN 12, Cr 1.16, t bili 0.5, ALP 58, AST 16, ALT 15.  HISTORY: Onset:  2004-2005 Location:  right frontal/retro-orbital Quality:  excruciating Initial Intensity:  10/10 Aura:  no  Prodrome:  no Associated symptoms:  Right eye conjunctival injection, lacrimation and droopy eyelid Initial Duration:  30 to 40 minutes  Initial Frequency:  Occurs in clusters during the warm months from around April-May until September.  They occur 2-3 times a day Triggers:  No Relieving factors;  no Activity:  cannot function during attack  Past NSAIDS:  multiple Past analgesics:  Fioricet, Percocet Past abortive triptans:  sumatriptan 5mg  NS Past antihypertensive medications:  verapamil 180mg  CR (higher doses ineffective) Past antidepressant medications:  no Past anticonvulsant medications:   topiramate 100mg  twice daily (higher doses ineffective) Past vitamins/Herbal/Supplements:  no Past antihistamines/decongestants:  no Other past therapy:  Prednisone during cluster  Alcohol:  rarely Smoker:  no  He had an MRI of the brain with and without contrast on 10/10/06 to evaluate headache, which was personally reviewed and revealed and is unremarkable.  Past Medical History: Past Medical History:  Diagnosis Date  . Headaches, cluster   . HIV infection (HCC)     Medications: Outpatient Encounter Medications as of 08/08/2018  Medication Sig  . cyclobenzaprine (FLEXERIL) 10 MG tablet Take 1 tablet (10 mg total) by mouth 2 (two) times daily as needed.  . divalproex (DEPAKOTE ER) 500 MG 24 hr tablet TAKE 1 TABLET BY MOUTH AT BEDTIME  . emtricitabine-rilpivir-tenofovir AF (ODEFSEY) 200-25-25 MG TABS tablet Take 1 tablet by mouth daily with breakfast.  . Melatonin 200 MCG TABS Take 1 tablet by mouth at bedtime as needed.  Marland Kitchen. omeprazole (PRILOSEC OTC) 20 MG tablet Take 20 mg by mouth daily as needed.   . SUMAtriptan (IMITREX) 5 MG/ACT nasal spray One spray in  each nostril once. If no relief may repeat in 2 hours  . vitamin B-12 (CYANOCOBALAMIN) 250 MCG tablet Take 250 mcg by mouth daily.  . vitamin C (ASCORBIC ACID) 500 MG tablet Take 500 mg by mouth daily.  Marland Kitchen ZYCLARA 3.75 % CREA  APPLY TOPICALLY TO THE AFFECTED AREA EVERY OTHER DAY. LEAVE ON FOR 6 HOURS THEN WASH OFF  . ZYCLARA PUMP 2.5 % CREA APPLY TOPICALLY EVERY OTHER DAY AND LEAVE ON FOR 6 HOURS THEN WASH OFF (Patient not taking: Reported on 03/22/2018)   No facility-administered encounter medications on file as of 08/08/2018.     Allergies: No Known Allergies  Family History: Family History  Problem Relation Age of Onset  . Stroke Mother   . Heart disease Father     Social History: Social History   Socioeconomic History  . Marital status: Single    Spouse name: Not on file  . Number of children: Not on file  . Years of education: Not on file  . Highest education level: Not on file  Occupational History  . Not on file  Social Needs  . Financial resource strain: Not on file  . Food insecurity:    Worry: Not on file    Inability: Not on file  . Transportation needs:    Medical: Not on file    Non-medical: Not on file  Tobacco Use  . Smoking status: Former Smoker    Years: 1.00    Types: Cigarettes    Start date: 09/25/2011  . Smokeless tobacco: Never Used  Substance and Sexual Activity  . Alcohol use: Yes    Alcohol/week: 3.0 standard drinks    Types: 3 Glasses of wine per week    Comment: rarely  . Drug use: No  . Sexual activity: Yes    Partners: Male    Birth control/protection: Condom    Comment: pt. declined condoms  Lifestyle  . Physical activity:    Days per week: Not on file    Minutes per session: Not on file  . Stress: Not on file  Relationships  . Social connections:    Talks on phone: Not on file    Gets together: Not on file    Attends religious service: Not on file    Active member of club or organization: Not on file    Attends meetings of clubs or organizations: Not on file    Relationship status: Not on file  . Intimate partner violence:    Fear of current or ex partner: Not on file    Emotionally abused: Not on file    Physically abused: Not on file     Forced sexual activity: Not on file  Other Topics Concern  . Not on file  Social History Narrative  . Not on file    Observations/Objective:   There were no vitals filed for this visit.  Alert and oriented.  Speech fluent and not dysarthric.  Language intact.  Eyes orthophoric and move in all directions.  Face symmetric.  Assessment and Plan:   Episodic cluster headache, not intractable  1.  Ideally, I would like to start him on Emgality.  However, I am not sure if this would be covered under his plan.  We will find out.  In the meantime, we will increase Depakote ER 1000 mg daily. 2.  Sumatriptan 5 mg nasal spray as needed 3.  Follow-up in 3 to 4 months.  Follow Up Instructions:    -I discussed the  assessment and treatment plan with the patient. The patient was provided an opportunity to ask questions and all were answered. The patient agreed with the plan and demonstrated an understanding of the instructions.   The patient was advised to call back or seek an in-person evaluation if the symptoms worsen or if the condition fails to improve as anticipated.   Dudley Major, DO

## 2018-08-08 ENCOUNTER — Telehealth (INDEPENDENT_AMBULATORY_CARE_PROVIDER_SITE_OTHER): Payer: Self-pay | Admitting: Neurology

## 2018-08-08 ENCOUNTER — Other Ambulatory Visit: Payer: Self-pay

## 2018-08-08 ENCOUNTER — Encounter: Payer: Self-pay | Admitting: *Deleted

## 2018-08-08 ENCOUNTER — Encounter: Payer: Self-pay | Admitting: Neurology

## 2018-08-08 DIAGNOSIS — G44019 Episodic cluster headache, not intractable: Secondary | ICD-10-CM

## 2018-08-16 ENCOUNTER — Telehealth: Payer: Self-pay | Admitting: Neurology

## 2018-08-16 NOTE — Telephone Encounter (Signed)
Patient was seen 08-08-18 by Everlena Cooper. He states that someone was suppose to call him back about the Emgality and the Depakote medications. He has not heard anything and would like a call back

## 2018-08-16 NOTE — Telephone Encounter (Signed)
Follow up.

## 2018-08-17 ENCOUNTER — Other Ambulatory Visit: Payer: Self-pay | Admitting: *Deleted

## 2018-08-17 MED ORDER — DIVALPROEX SODIUM ER 500 MG PO TB24: 1000.0000 mg | ORAL_TABLET | Freq: Every day | ORAL | 5 refills | Status: DC

## 2018-08-17 NOTE — Telephone Encounter (Signed)
Rx sent in for Depakote.  Sandi will follow up with the emgality.

## 2018-08-21 ENCOUNTER — Other Ambulatory Visit: Payer: Self-pay

## 2018-08-21 ENCOUNTER — Telehealth: Payer: Self-pay | Admitting: Neurology

## 2018-08-21 MED ORDER — GALCANEZUMAB-GNLM 120 MG/ML ~~LOC~~ SOAJ: 240.0000 mg | Freq: Once | SUBCUTANEOUS | 0 refills | Status: AC

## 2018-08-25 ENCOUNTER — Other Ambulatory Visit: Payer: Self-pay

## 2018-08-25 MED ORDER — GALCANEZUMAB-GNLM 120 MG/ML ~~LOC~~ SOAJ: 120.0000 mg | SUBCUTANEOUS | 11 refills | Status: DC

## 2018-08-25 NOTE — Telephone Encounter (Signed)
Patient is calling in about a medication-emgality supposed to be sent to the [facility] specialty pharm. Please send. Thanks!

## 2018-08-25 NOTE — Telephone Encounter (Signed)
Called and advised Pt Emgality was sent in, provided him with the ph# to the pharmacy to call with any questions

## 2018-08-31 ENCOUNTER — Telehealth: Payer: Self-pay | Admitting: Neurology

## 2018-08-31 NOTE — Telephone Encounter (Signed)
Patient is calling in again- needs the [facility]ed to [facility].Please complete. Thanks!

## 2018-08-31 NOTE — Telephone Encounter (Signed)
Patient called needing to know if the application that was faxed to our office had been signed and faxed back? Please Call. Thanks

## 2018-08-31 NOTE — Telephone Encounter (Signed)
Talked to Pt, advised will have Dr. Everlena Cooper sign when he is back in the office.

## 2018-09-05 NOTE — Telephone Encounter (Signed)
Patient left msg with after hours about returning a call to the nurse. Thanks!

## 2018-09-05 NOTE — Telephone Encounter (Signed)
Called and LMOVM advising Pt that Dr Everlena Cooper returned to the office today and signed the paperwork and it has been faxed.

## 2018-09-12 ENCOUNTER — Telehealth: Payer: Self-pay | Admitting: Neurology

## 2018-09-12 NOTE — Telephone Encounter (Signed)
Pt left message with after hour service on 09-11-18 @ 5:51   Caller would like to leave a message about medication

## 2018-09-12 NOTE — Telephone Encounter (Signed)
Patient calling back regarding his medication. Please Call. Thanks

## 2018-09-12 NOTE — Telephone Encounter (Signed)
Patient called and ask to speak with Office Manager regarding his Rx. I spoke to patient and he explained [facility] through [facility] is stating that they have not received our fax dated for 09/05/2018 for [employer]  I apologized for the inconvenience and informed patient that I would call him back and follow-up once I have spoken to the pharmacy. Called [facility] and spoke to Ronda who was able to transfer me to the pharmacist Nadine Counts) who took the verbal order from Kingsley, CMA. Called patient back and updated him to let him know that we gave the verbal order for his Rx and per the pharmacy it would be processed in 24 -48 hours. Patient verbalized understanding and was satisfied that were able to give the verbal order to get his Rx processed.

## 2018-09-12 NOTE — Telephone Encounter (Signed)
Emgality- refill to the prescription RX? (he couldn't remember name but thought [facility] was it)  The fax: [phone]/ phone #: [phone] for them. Thanks!

## 2018-10-02 ENCOUNTER — Other Ambulatory Visit: Payer: Self-pay | Admitting: Infectious Diseases

## 2018-10-02 DIAGNOSIS — B20 Human immunodeficiency virus [HIV] disease: Secondary | ICD-10-CM

## 2018-11-27 ENCOUNTER — Telehealth: Payer: Self-pay | Admitting: Neurology

## 2018-11-27 MED ORDER — EMGALITY 120 MG/ML ~~LOC~~ SOAJ: 120.0000 mg | SUBCUTANEOUS | 11 refills | Status: DC

## 2018-11-27 NOTE — Telephone Encounter (Signed)
Caleld and spoke with Pt. He needs new Rx for [facility] sent to: [facility] a division of [facility] [phone]  Am unable to send electronically, am printing, will have Dr. Tomi Likens sign and will fax

## 2018-11-27 NOTE — Telephone Encounter (Signed)
Called and LMOVM for Pt to return my call 

## 2018-11-27 NOTE — Addendum Note (Signed)
Addended by: Clois Comber on: 11/27/2018 04:22 PM   Modules accepted: Orders

## 2018-11-27 NOTE — Telephone Encounter (Signed)
Pt wants to talk to someone about his medication emgality

## 2018-11-30 NOTE — Progress Notes (Deleted)
NEUROLOGY FOLLOW UP OFFICE NOTE  Kenneth [name] [id number]  HISTORY OF PRESENT ILLNESS: Kenneth [name] is a 38 year old right-handed man with HIV who follows up for cluster headaches.  UPDATE: Cluster restarted in mid-March.  He was started on Emgality and Depakote increased. Current NSAIDS: no Current analgesics: no Current triptans: Sumatriptan 5mg  NS Current anti-emetic: no Current muscle relaxants: no Current anti-anxiolytic: no Current sleep aide: melatonin 200mcg Current Antihypertensive medications:no Current Antidepressant medications: no Current Anticonvulsant medications:Depakote ER 1000mg  at bedtime Current anti-CGRP:  Emgality Other therapy: O2 Other medication: Atripla  Depression/anxiety: stable.  HISTORY: Onset:  2004-2005 Location: right frontal/retro-orbital Quality: excruciating Initial Intensity: 10/10 Aura: no Prodrome: no Associated symptoms:  Right eye conjunctival injection, lacrimation and droopy eyelid Initial Duration: 30 to 40 minutes  Initial Frequency: Occurs in clusters during the warm months from around April-May until September. They occur 2-3 times a day Triggers:  none Relieving factors:  none Activity: cannot function during attack  Past NSAIDS: multiple Past analgesics: Fioricet, Percocet Past abortive triptans: sumatriptan 5mg  NS Past antihypertensive medications: verapamil 180mg  CR (higher doses ineffective) Past antidepressant medications: no Past anticonvulsant medications: topiramate 100mg  twice daily (higher doses ineffective) Past vitamins/Herbal/Supplements: no Past antihistamines/decongestants: no Other past therapy: Prednisone during cluster  Alcohol: rarely Smoker: no  He had an MRI of the brain with and without contrast on 10/10/06 to evaluate headache, which was personally reviewed and revealed and is unremarkable.  PAST MEDICAL HISTORY: Past Medical History:   Diagnosis Date  . Headaches, cluster   . HIV infection [facility](HCC)     MEDICATIONS: Current Outpatient Medications on File Prior to Visit  Medication Sig Dispense Refill  . cyclobenzaprine (FLEXERIL) 10 MG tablet Take 1 tablet (10 mg total) by mouth 2 (two) times daily as needed. 10 tablet 0  . divalproex (DEPAKOTE ER) 500 MG 24 hr tablet Take 2 tablets (1,000 mg total) by mouth daily. 60 tablet 5  . Galcanezumab-gnlm (EMGALITY) 120 MG/ML SOAJ Inject 120 mg into the skin every 30 (thirty) days. 1 pen 11  . Galcanezumab-gnlm (EMGALITY) 120 MG/ML SOAJ Inject 120 mg into the skin every 30 (thirty) days. 1 pen 11  . Melatonin 200 MCG TABS Take 1 tablet by mouth at bedtime as needed.    . ODEFSEY 200-25-25 MG TABS tablet TAKE 1 TABLET BY MOUTH DAILY WITH BREAKFAST 90 tablet 0  . omeprazole (PRILOSEC OTC) 20 MG tablet Take 20 mg by mouth daily as needed.     . SUMAtriptan (IMITREX) 5 MG/ACT nasal spray One spray in each nostril once. If no relief may repeat in 2 hours 18 Inhaler 3  . vitamin B-12 (CYANOCOBALAMIN) 250 MCG tablet Take 250 mcg by mouth daily.    . vitamin C (ASCORBIC ACID) 500 MG tablet Take 500 mg by mouth daily.    Marland Kitchen. ZYCLARA 3.75 % CREA APPLY TOPICALLY TO THE AFFECTED AREA EVERY OTHER DAY. LEAVE ON FOR 6 HOURS THEN WASH OFF 28 each 0  . ZYCLARA PUMP 2.5 % CREA APPLY TOPICALLY EVERY OTHER DAY AND LEAVE ON FOR 6 HOURS THEN WASH OFF 7.5 g 0   No current facility-administered medications on file prior to visit.     ALLERGIES: No Known Allergies  FAMILY HISTORY: Family History  Problem Relation Age of Onset  . Stroke Mother   . Heart disease Father    ***.  SOCIAL HISTORY: Social History   Socioeconomic History  . Marital status: Significant Other    Spouse name: Not  on file  . Number of children: Not on file  . Years of education: Not on file  . Highest education level: Not on file  Occupational History  . Not on file  Social Needs  . Financial resource strain: Not on  file  . Food insecurity    Worry: Not on file    Inability: Not on file  . Transportation needs    Medical: Not on file    Non-medical: Not on file  Tobacco Use  . Smoking status: Former Smoker    Years: 1.00    Types: Cigarettes    Start date: 09/25/2011  . Smokeless tobacco: Never Used  Substance and Sexual Activity  . Alcohol use: Yes    Alcohol/week: 3.0 standard drinks    Types: 3 Glasses of wine per week    Comment: rarely  . Drug use: No  . Sexual activity: Yes    Partners: Male    Birth control/protection: Condom    Comment: pt. declined condoms  Lifestyle  . Physical activity    Days per week: Not on file    Minutes per session: Not on file  . Stress: Not on file  Relationships  . Social Herbalist on phone: Not on file    Gets together: Not on file    Attends religious service: Not on file    Active member of club or organization: Not on file    Attends meetings of clubs or organizations: Not on file    Relationship status: Not on file  . Intimate partner violence    Fear of current or ex partner: Not on file    Emotionally abused: Not on file    Physically abused: Not on file    Forced sexual activity: Not on file  Other Topics Concern  . Not on file  Social History Narrative  . Not on file    REVIEW OF SYSTEMS: Constitutional: No fevers, chills, or sweats, no generalized fatigue, change in appetite Eyes: No visual changes, double vision, eye pain Ear, nose and throat: No hearing loss, ear pain, nasal congestion, sore throat Cardiovascular: No chest pain, palpitations Respiratory:  No shortness of breath at rest or with exertion, wheezes GastrointestinaI: No nausea, vomiting, diarrhea, abdominal pain, fecal incontinence Genitourinary:  No dysuria, urinary retention or frequency Musculoskeletal:  No neck pain, back pain Integumentary: No rash, pruritus, skin lesions Neurological: as above Psychiatric: No depression, insomnia, anxiety  Endocrine: No palpitations, fatigue, diaphoresis, mood swings, change in appetite, change in weight, increased thirst Hematologic/Lymphatic:  No purpura, petechiae. Allergic/Immunologic: no itchy/runny eyes, nasal congestion, recent allergic reactions, rashes  PHYSICAL EXAM: *** General: No acute distress.  Patient appears ***-groomed.   Head:  Normocephalic/atraumatic Eyes:  Fundi examined but not visualized Neck: supple, no paraspinal tenderness, full range of motion Heart:  Regular rate and rhythm Lungs:  Clear to auscultation bilaterally Back: No paraspinal tenderness Neurological Exam: alert and oriented to person, place, and time. Attention span and concentration intact, recent and remote memory intact, fund of knowledge intact.  Speech fluent and not dysarthric, language intact.  CN II-XII intact. Bulk and tone normal, muscle strength 5/5 throughout.  Sensation to light touch, temperature and vibration intact.  Deep tendon reflexes 2+ throughout, toes downgoing.  Finger to nose and heel to shin testing intact.  Gait normal, Romberg negative.  IMPRESSION: ***  PLAN: ***  Metta Clines, DO  CC: ***

## 2018-12-01 ENCOUNTER — Ambulatory Visit: Payer: Self-pay | Admitting: Neurology

## 2019-01-03 ENCOUNTER — Other Ambulatory Visit: Payer: Self-pay | Admitting: Infectious Diseases

## 2019-01-03 DIAGNOSIS — B20 Human immunodeficiency virus [HIV] disease: Secondary | ICD-10-CM

## 2019-01-08 NOTE — Progress Notes (Addendum)
Virtual Visit via Video Note The purpose of this virtual visit is to provide medical care while limiting exposure to the novel coronavirus.    Consent was obtained for video visit:  Yes Answered questions that patient had about telehealth interaction:  Yes I discussed the limitations, risks, security and privacy concerns of performing an evaluation and management service by telemedicine. I also discussed with the patient that there may be a patient responsible charge related to this service. The patient expressed understanding and agreed to proceed.  Pt location: Home Physician Location: Office Name of referring provider:  Ginnie SmartHatcher, Jeffrey C, MD I connected with Maryann ConnersJames L Meriweather at patients initiation/request on 01/10/2019 at 10:50 AM EDT by video enabled telemedicine application and verified that I am speaking with the correct person using two identifiers. Pt MRN:  [id number] Pt DOB:  1980/05/06 Video Participants:  Maryann ConnersJames L Doolan;   History of Present Illness:  Juliann PulseJames Bradner is a 38 year old right-handed man with HIV who follows up for cluster headaches.  UPDATE: Started on Emgality in May.  No headaches since starting Emgality.   Current NSAIDS: no Current analgesics: no Current triptans: Sumatriptan 5mg  NS Current anti-emetic: no Current muscle relaxants: no Current anti-anxiolytic: no Current sleep aide: melatonin 200mcg Current Antihypertensive medications:no Current Antidepressant medications: no Current Anticonvulsant medications:Depakote ER 1000mg  at bedtime Current anti-CGRP:  Emgality Other therapy: O2 Other medication: Atripla  Depression/anxiety: stable.  HISTORY: Onset:  2004-2005 Location: right frontal/retro-orbital Quality: excruciating Initial Intensity: 10/10 Aura: no Prodrome: no Associated symptoms:  Right eye conjunctival injection, lacrimation and droopy eyelid Initial Duration: 30 to 40 minutes  Initial Frequency:  Occurs in clusters during the warm months from around April-May until September. They occur 2-3 times a day Triggers:  No Relieving factors;  no Activity: cannot function during attack  Past NSAIDS: multiple Past analgesics: Fioricet, Percocet Past abortive triptans: sumatriptan 5mg  NS Past antihypertensive medications: verapamil 180mg  CR (higher doses ineffective) Past antidepressant medications: no Past anticonvulsant medications: topiramate 100mg  twice daily (higher doses ineffective) Past vitamins/Herbal/Supplements: no Past antihistamines/decongestants: no Other past therapy: Prednisone during cluster  Alcohol: rarely Smoker: no  He had an MRI of the brain with and without contrast on 10/10/06 to evaluate headache, which was personally reviewed and revealed and is unremarkable.   Past Medical History: Past Medical History:  Diagnosis Date  . Headaches, cluster   . HIV infection (HCC)     Medications: Outpatient Encounter Medications as of 01/10/2019  Medication Sig  . cyclobenzaprine (FLEXERIL) 10 MG tablet Take 1 tablet (10 mg total) by mouth 2 (two) times daily as needed.  . divalproex (DEPAKOTE ER) 500 MG 24 hr tablet Take 2 tablets (1,000 mg total) by mouth daily.  . Galcanezumab-gnlm (EMGALITY) 120 MG/ML SOAJ Inject 120 mg into the skin every 30 (thirty) days.  . Galcanezumab-gnlm (EMGALITY) 120 MG/ML SOAJ Inject 120 mg into the skin every 30 (thirty) days.  . Melatonin 200 MCG TABS Take 1 tablet by mouth at bedtime as needed.  . ODEFSEY 200-25-25 MG TABS tablet TAKE 1 TABLET BY MOUTH DAILY WITH BREAKFAST  . omeprazole (PRILOSEC OTC) 20 MG tablet Take 20 mg by mouth daily as needed.   . SUMAtriptan (IMITREX) 5 MG/ACT nasal spray One spray in each nostril once. If no relief may repeat in 2 hours  . vitamin B-12 (CYANOCOBALAMIN) 250 MCG tablet Take 250 mcg by mouth daily.  . vitamin C (ASCORBIC ACID) 500 MG tablet Take 500 mg by mouth daily.  .Marland Kitchen  ZYCLARA 3.75 % CREA APPLY TOPICALLY TO THE AFFECTED AREA EVERY OTHER DAY. LEAVE ON FOR 6 HOURS THEN [place] OFF  . ZYCLARA PUMP 2.5 % CREA APPLY TOPICALLY EVERY OTHER DAY AND LEAVE ON FOR 6 HOURS THEN [place] OFF   No facility-administered encounter medications on file as of 01/10/2019.     Allergies: No Known Allergies  Family History: Family History  Problem Relation Age of Onset  . Stroke Mother   . Heart disease Father     Social History: Social History   Socioeconomic History  . Marital status: Significant Other    Spouse name: Not on file  . Number of children: Not on file  . Years of education: Not on file  . Highest education level: Not on file  Occupational History  . Not on file  Social Needs  . Financial resource strain: Not on file  . Food insecurity    Worry: Not on file    Inability: Not on file  . Transportation needs    Medical: Not on file    Non-medical: Not on file  Tobacco Use  . Smoking status: Former Smoker    Years: 1.00    Types: Cigarettes    Start date: 09/25/2011  . Smokeless tobacco: Never Used  Substance and Sexual Activity  . Alcohol use: Yes    Alcohol/week: 3.0 standard drinks    Types: 3 Glasses of wine per week    Comment: rarely  . Drug use: No  . Sexual activity: Yes    Partners: Male    Birth control/protection: Condom    Comment: pt. declined condoms  Lifestyle  . Physical activity    Days per week: Not on file    Minutes per session: Not on file  . Stress: Not on file  Relationships  . Social Herbalist on phone: Not on file    Gets together: Not on file    Attends religious service: Not on file    Active member of club or organization: Not on file    Attends meetings of clubs or organizations: Not on file    Relationship status: Not on file  . Intimate partner violence    Fear of current or ex partner: Not on file    Emotionally abused: Not on file    Physically abused: Not on file    Forced sexual activity:  Not on file  Other Topics Concern  . Not on file  Social History Narrative  . Not on file    Observations/Objective:   Height 6' (1.829 m), weight 180 lb (81.6 kg). No acute distress.  Alert and oriented.  Speech fluent and not dysarthric.  Language intact.  Eyes orthophoric on primary gaze.  Face symmetric.  Assessment and Plan:   Episodic cluster headache, not intractable.  Doing well.  1.  For preventative management, Emgality.  Also on Depakote ER 1000mg  daily.  Check CBC and CMP. 2.  For abortive therapy, sumatriptan 5mg  NS 3.  Limit use of pain relievers to no more than 2 days out of week to prevent risk of rebound or medication-overuse headache. 4.  Keep headache diary 5.  Exercise, hydration, caffeine cessation, sleep hygiene, monitor for and avoid triggers 6.  Consider:  magnesium citrate 400mg  daily, riboflavin 400mg  daily, and coenzyme Q10 100mg  three times daily 7. Always keep in mind that currently taking a hormone or birth control may be a possible trigger or aggravating factor for migraine. 8. Follow up  6 months  Follow Up Instructions:    -I discussed the assessment and treatment plan with the patient. The patient was provided an opportunity to ask questions and all were answered. The patient agreed with the plan and demonstrated an understanding of the instructions.   The patient was advised to call back or seek an in-person evaluation if the symptoms worsen or if the condition fails to improve as anticipated.  Cira Servant, DO

## 2019-01-10 ENCOUNTER — Encounter: Payer: Self-pay | Admitting: Neurology

## 2019-01-10 ENCOUNTER — Telehealth: Payer: Self-pay | Admitting: *Deleted

## 2019-01-10 ENCOUNTER — Other Ambulatory Visit: Payer: Self-pay

## 2019-01-10 ENCOUNTER — Telehealth: Payer: Self-pay

## 2019-01-10 ENCOUNTER — Telehealth (INDEPENDENT_AMBULATORY_CARE_PROVIDER_SITE_OTHER): Payer: Self-pay | Admitting: Neurology

## 2019-01-10 VITALS — Ht 72.0 in | Wt 180.0 lb

## 2019-01-10 DIAGNOSIS — G44019 Episodic cluster headache, not intractable: Secondary | ICD-10-CM

## 2019-01-10 NOTE — Telephone Encounter (Signed)
MD stated patient did not have his Emgality 120mg  injection for this month. Called and spoke to pharmacy - they have never filled this med for patient. Pharmacy said that his insurance doesn't cover it and they had given patient the number in May to [place] assistance [phone]..   Call placed to [facility] Sep 05 2018 - Sep 05 2019 enrolled in patient asst and in May he received his loading dose and next 3 months. Should get next shipment next week - patient will have to sign for it. It is being processed.  Called patient and let him know where his Emgality will be coming from. He has [name]'s number if any other questions.  Informed patient to come get blood drawn at our office - pt stated he has MD appt for lab draw at Dr. Shana Chute (infectious disease office). Called to office and Tammy stated he is having a CMP/CBC drawn tomorrow and she said to d/c my order and they will send a copy to Dr. Tomi Likens. Called patient back and told him above and he does not need to get labs from [place]. Verbalized understanding.   Dr. Tomi Likens FYI above.

## 2019-01-10 NOTE — Addendum Note (Signed)
Addended by: Jesse Fall on: 01/10/2019 12:03 PM   Modules accepted: Orders

## 2019-01-10 NOTE — Telephone Encounter (Signed)
Dr Georgie Chard office is calling to see if we can ensure the CMP and CBC drawn with patients next labs be faxed to his office.  Patient was seen there today.   Laverle Patter, RN

## 2019-01-11 ENCOUNTER — Encounter: Payer: Self-pay | Admitting: Infectious Diseases

## 2019-01-11 ENCOUNTER — Ambulatory Visit (INDEPENDENT_AMBULATORY_CARE_PROVIDER_SITE_OTHER): Payer: Self-pay | Admitting: Infectious Diseases

## 2019-01-11 VITALS — BP 115/80 | HR 94 | Temp 98.7°F

## 2019-01-11 DIAGNOSIS — G43809 Other migraine, not intractable, without status migrainosus: Secondary | ICD-10-CM

## 2019-01-11 DIAGNOSIS — B20 Human immunodeficiency virus [HIV] disease: Secondary | ICD-10-CM

## 2019-01-11 DIAGNOSIS — J301 Allergic rhinitis due to pollen: Secondary | ICD-10-CM

## 2019-01-11 DIAGNOSIS — Z113 Encounter for screening for infections with a predominantly sexual mode of transmission: Secondary | ICD-10-CM

## 2019-01-11 DIAGNOSIS — Z79899 Other long term (current) drug therapy: Secondary | ICD-10-CM

## 2019-01-11 DIAGNOSIS — Z23 Encounter for immunization: Secondary | ICD-10-CM

## 2019-01-11 DIAGNOSIS — A630 Anogenital (venereal) warts: Secondary | ICD-10-CM

## 2019-01-11 NOTE — Assessment & Plan Note (Signed)
Encouraged him to try OTC (claritin, allegra).

## 2019-01-11 NOTE — Addendum Note (Signed)
Addended by: Lenore Cordia on: 01/11/2019 05:10 PM   Modules accepted: Orders

## 2019-01-11 NOTE — Progress Notes (Signed)
   Subjective:    Patient ID: Kenneth [name], male    DOB: 1981/03/31, 38 y.o.   MRN: [id number]  HPI  38yo M with hx of HIV+ (Dx 2006?), tension/cluster headaches.Was prev seen by neuro. At last visit he was maintianed on depakote. He is also on emgality and has no further headaches.  He was on atripla until 10-2017 when changed to odefsy, to match his partner. No problems with this, takes with food.  Currently c/o cough. Has been sneezing more. No prev fall allergies (usually spring).   HIV 1 RNA Quant (copies/mL)  Date Value  03/07/2018 <20 NOT DETECTED  10/24/2017 <20 NOT DETECTED  01/18/2017 <20 NOT DETECTED   CD4 T Cell Abs (/uL)  Date Value  03/07/2018 840  10/24/2017 680  01/18/2017 670    Review of Systems  Constitutional: Negative for appetite change and unexpected weight change.  Respiratory: Positive for cough.   Gastrointestinal: Negative for constipation and diarrhea.  Genitourinary: Negative for difficulty urinating.  Neurological: Negative for headaches.  has been going to gym frequently.  Please see HPI. All other systems reviewed and negative.     Objective:   Physical Exam Constitutional:      Appearance: Normal appearance.  HENT:     Mouth/Throat:     Mouth: Mucous membranes are moist.     Pharynx: No oropharyngeal exudate.  Eyes:     Extraocular Movements: Extraocular movements intact.     Pupils: Pupils are equal, round, and reactive to light.  Neck:     Musculoskeletal: Normal range of motion and neck supple.  Cardiovascular:     Rate and Rhythm: Normal rate and regular rhythm.  Pulmonary:     Effort: Pulmonary effort is normal.     Breath sounds: Normal breath sounds.  Abdominal:     General: Abdomen is flat. Bowel sounds are normal. There is no distension.     Palpations: Abdomen is soft.     Tenderness: There is no abdominal tenderness.  Musculoskeletal:     Right lower leg: No edema.     Left lower leg: No edema.  Neurological:    General: No focal deficit present.     Mental Status: He is alert.  Psychiatric:        Mood and Affect: Mood normal.           Assessment & Plan:

## 2019-01-11 NOTE — Assessment & Plan Note (Addendum)
2nd Menveo PVC 23 Will check labs today.  Continue his current meds.  He takes PPI sparingly.  rtc in 9 months.

## 2019-01-11 NOTE — Addendum Note (Signed)
Addended by: Reggy Eye on: 01/11/2019 05:16 PM   Modules accepted: Orders

## 2019-01-11 NOTE — Assessment & Plan Note (Signed)
He is doing very well on his current rx.  Appreciate neuro f/u.

## 2019-01-11 NOTE — Addendum Note (Signed)
Addended by: Lenore Cordia on: 01/11/2019 05:06 PM   Modules accepted: Orders

## 2019-01-11 NOTE — Assessment & Plan Note (Signed)
Will f/u at his next visit re: need for anoscopy.  No need for HVP vax

## 2019-01-16 ENCOUNTER — Encounter: Payer: Self-pay | Admitting: Infectious Diseases

## 2019-01-16 ENCOUNTER — Telehealth: Payer: Self-pay

## 2019-01-16 NOTE — Telephone Encounter (Signed)
Patient called office today requesting new letter for work. States that HR is unable to take note that MD typed during last appointment. 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  Patient would like letter faxed to HR once completed and signed. Would like to be called once letter is faxed.  Fax: [phone]

## 2019-01-16 NOTE — Telephone Encounter (Signed)
Updated letter written.

## 2019-01-17 NOTE — Telephone Encounter (Signed)
Letter fax to HR as requested.

## 2019-01-17 NOTE — Telephone Encounter (Signed)
done

## 2019-01-17 NOTE — Progress Notes (Signed)
New letter written.

## 2019-01-17 NOTE — Telephone Encounter (Signed)
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 Routing Dr. Johnnye Sima. Eugenia Mcalpine

## 2019-01-17 NOTE — Telephone Encounter (Signed)
Thank you very much 

## 2019-01-18 ENCOUNTER — Other Ambulatory Visit: Payer: Self-pay | Admitting: Infectious Diseases

## 2019-01-18 ENCOUNTER — Other Ambulatory Visit: Payer: Self-pay

## 2019-01-18 DIAGNOSIS — B20 Human immunodeficiency virus [HIV] disease: Secondary | ICD-10-CM

## 2019-01-18 DIAGNOSIS — Z113 Encounter for screening for infections with a predominantly sexual mode of transmission: Secondary | ICD-10-CM

## 2019-01-18 DIAGNOSIS — Z79899 Other long term (current) drug therapy: Secondary | ICD-10-CM

## 2019-01-19 LAB — URINE CYTOLOGY ANCILLARY ONLY
Chlamydia: NEGATIVE
Chlamydia: NEGATIVE
Neisseria Gonorrhea: NEGATIVE
Neisseria Gonorrhea: NEGATIVE

## 2019-01-19 LAB — T-HELPER CELL (CD4) - (RCID CLINIC ONLY)
CD4 % Helper T Cell: 48 % (ref 33–65)
CD4 T Cell Abs: 1822 /uL — ABNORMAL HIGH (ref 400–1790)

## 2019-01-24 LAB — COMPREHENSIVE METABOLIC PANEL
AG Ratio: 1.5 (calc) (ref 1.0–2.5)
ALT: 17 U/L (ref 9–46)
AST: 16 U/L (ref 10–40)
Albumin: 4.4 g/dL (ref 3.6–5.1)
Alkaline phosphatase (APISO): 53 U/L (ref 36–130)
BUN: 13 mg/dL (ref 7–25)
CO2: 29 mmol/L (ref 20–32)
Calcium: 9.7 mg/dL (ref 8.6–10.3)
Chloride: 103 mmol/L (ref 98–110)
Creat: 1.2 mg/dL (ref 0.60–1.35)
Globulin: 2.9 g/dL (calc) (ref 1.9–3.7)
Glucose, Bld: 82 mg/dL (ref 65–99)
Potassium: 4.1 mmol/L (ref 3.5–5.3)
Sodium: 140 mmol/L (ref 135–146)
Total Bilirubin: 0.5 mg/dL (ref 0.2–1.2)
Total Protein: 7.3 g/dL (ref 6.1–8.1)

## 2019-01-24 LAB — HIV-1 RNA QUANT-NO REFLEX-BLD
HIV 1 RNA Quant: <20 copies/mL
HIV-1 RNA Quant, Log: <1.3 Log copies/mL

## 2019-01-24 LAB — LIPID PANEL
Cholesterol: 186 mg/dL (ref <200)
HDL: 40 mg/dL (ref >=40)
LDL Cholesterol (Calc): 120 mg/dL (calc) — ABNORMAL HIGH
Non-HDL Cholesterol (Calc): 146 mg/dL (calc) — ABNORMAL HIGH (ref <130)
Total CHOL/HDL Ratio: 4.7 (calc) (ref <5.0)
Triglycerides: 151 mg/dL — ABNORMAL HIGH (ref <150)

## 2019-01-24 LAB — CBC
HCT: 44.7 % (ref 38.5–50.0)
Hemoglobin: 14.7 g/dL (ref 13.2–17.1)
MCH: 28.9 pg (ref 27.0–33.0)
MCHC: 32.9 g/dL (ref 32.0–36.0)
MCV: 88 fL (ref 80.0–100.0)
MPV: 11.1 fL (ref 7.5–12.5)
Platelets: 287 10*3/uL (ref 140–400)
RBC: 5.08 10*6/uL (ref 4.20–5.80)
RDW: 13.4 % (ref 11.0–15.0)
WBC: 10.1 10*3/uL (ref 3.8–10.8)

## 2019-01-24 LAB — RPR: RPR Ser Ql: NONREACTIVE

## 2019-02-26 ENCOUNTER — Other Ambulatory Visit: Payer: Self-pay | Admitting: Infectious Diseases

## 2019-02-26 DIAGNOSIS — B20 Human immunodeficiency virus [HIV] disease: Secondary | ICD-10-CM

## 2019-03-30 ENCOUNTER — Other Ambulatory Visit: Payer: Self-pay | Admitting: Neurology

## 2019-03-30 NOTE — Telephone Encounter (Signed)
Requested Prescriptions   Pending Prescriptions Disp Refills  . divalproex (DEPAKOTE ER) 500 MG 24 hr tablet [Pharmacy Med Name: DIVALPROEX EXTENDED RELEASE 500MG  T] 60 tablet 5    Sig: TAKE 2 TABLETS(1000 MG) BY MOUTH DAILY   Rx last filled: 08/17/18 #60 5 refills  Pt last seen: 01/10/19   Follow up appt scheduled:07/11/2019
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
## 2019-07-10 NOTE — Progress Notes (Signed)
Patient did not respond to text with link for virtual visit and did not answer phone.  Had to leave message.

## 2019-07-11 ENCOUNTER — Telehealth (INDEPENDENT_AMBULATORY_CARE_PROVIDER_SITE_OTHER): Payer: Self-pay | Admitting: Neurology

## 2019-07-11 ENCOUNTER — Other Ambulatory Visit: Payer: Self-pay

## 2019-08-12 NOTE — Progress Notes (Signed)
Virtual Visit via Video Note The purpose of this virtual visit is to provide medical care while limiting exposure to the novel coronavirus.    Consent was obtained for video visit:  Yes Answered questions that patient had about telehealth interaction:  Yes I discussed the limitations, risks, security and privacy concerns of performing an evaluation and management service by telemedicine. I also discussed with the patient that there may be a patient responsible charge related to this service. The patient expressed understanding and agreed to proceed.  Pt location: Home Physician Location: office Name of referring provider:  Campbell Riches, MD I connected with Kenneth [name] at patients initiation/request on 08/13/2019 at  1:30 PM EDT by video enabled telemedicine application and verified that I am speaking with the correct person using two identifiers. Pt MRN:  [id number] Pt DOB:  January 21, 1981 Video Participants:  Kenneth [name]   History of Present Illness:  Kenneth [name] is a 39 year old right-handed man with HIV who follows up for cluster headaches.  ID note reviewed.   UPDATE: No headaches since starting Emgality last May. Current NSAIDS:  no Current analgesics:  no Current triptans:  Sumatriptan 5mg  NS Current anti-emetic:  no Current muscle relaxants:  no Current anti-anxiolytic:  no Current sleep aide:  melatonin 252mcg Current Antihypertensive medications:  no Current Antidepressant medications:  no Current Anticonvulsant medications: Depakote ER 1000mg  at bedtime Current anti-CGRP:  Emgality Other therapy:  O2 (concentrator not working but hasn't needed it).   Other medication:  Atripla   Depression/anxiety:  stable.   01/18/2019 LABS:  CBC with WBC 10.1, HGB 14.7, HCT 44.7, PLT 287; CMP with Na 140, K 4.1, Cl 103, CO2 29, glucose 82, BUN 13, Cr 1.20, t bili 0.5, ALP 53, AST 16, ALT 17.   HISTORY: Onset:  2004-2005 Location:  right  frontal/retro-orbital Quality:  excruciating Initial Intensity:  10/10 Aura:  no Prodrome:  no Associated symptoms:  Right eye conjunctival injection, lacrimation and droopy eyelid Initial Duration:  30 to 40 minutes  Initial Frequency:  Occurs in clusters during the warm months from around April-May until September.  They occur 2-3 times a day Triggers:  No Relieving factors;  no Activity:  cannot function during attack   Past NSAIDS:  multiple Past analgesics:  Fioricet, Percocet Past abortive triptans:  sumatriptan 5mg  NS Past antihypertensive medications:  verapamil 180mg  CR (higher doses ineffective) Past antidepressant medications:  no Past anticonvulsant medications:   topiramate 100mg  twice daily (higher doses ineffective) Past vitamins/Herbal/Supplements:  no Past antihistamines/decongestants:  no Other past therapy:  Prednisone during cluster   Alcohol:  rarely Smoker:  no   He had an MRI of the brain with and without contrast on 10/10/06 to evaluate headache, which was personally reviewed and revealed and is unremarkable.  Past Medical History: Past Medical History:  Diagnosis Date  . Headaches, cluster   . HIV infection ([place])     Medications: Outpatient Encounter Medications as of 08/13/2019  Medication Sig  . cyclobenzaprine (FLEXERIL) 10 MG tablet Take 1 tablet (10 mg total) by mouth 2 (two) times daily as needed.  . divalproex (DEPAKOTE ER) 500 MG 24 hr tablet TAKE 2 TABLETS(1000 MG) BY MOUTH DAILY  . Galcanezumab-gnlm (EMGALITY) 120 MG/ML SOAJ Inject 120 mg into the skin every 30 (thirty) days.  . Galcanezumab-gnlm (EMGALITY) 120 MG/ML SOAJ Inject 120 mg into the skin every 30 (thirty) days.  . Melatonin 200 MCG TABS Take 1 tablet by mouth at bedtime  as needed.  . ODEFSEY 200-25-25 MG TABS tablet TAKE 1 TABLET BY MOUTH DAILY WITH BREAKFAST  . omeprazole (PRILOSEC OTC) 20 MG tablet Take 20 mg by mouth daily as needed.   . SUMAtriptan (IMITREX) 5 MG/ACT nasal  spray One spray in each nostril once. If no relief may repeat in 2 hours  . vitamin B-12 (CYANOCOBALAMIN) 250 MCG tablet Take 250 mcg by mouth daily.  . vitamin C (ASCORBIC ACID) 500 MG tablet Take 500 mg by mouth daily.  Marland Kitchen ZYCLARA 3.75 % CREA APPLY TOPICALLY TO THE AFFECTED AREA EVERY OTHER DAY. LEAVE ON FOR 6 HOURS THEN [place] OFF  . ZYCLARA PUMP 2.5 % CREA APPLY TOPICALLY EVERY OTHER DAY AND LEAVE ON FOR 6 HOURS THEN WASH OFF   No facility-administered encounter medications on file as of 08/13/2019.    Allergies: No Known Allergies  Family History: Family History  Problem Relation Age of Onset  . Stroke Mother   . Heart disease Father     Social History: Social History   Socioeconomic History  . Marital status: Significant Other    Spouse name: Not on file  . Number of children: Not on file  . Years of education: Not on file  . Highest education level: Not on file  Occupational History  . Not on file  Tobacco Use  . Smoking status: Former Smoker    Years: 1.00    Types: Cigarettes    Start date: 09/25/2011  . Smokeless tobacco: Never Used  Substance and Sexual Activity  . Alcohol use: Yes    Alcohol/week: 3.0 standard drinks    Types: 3 Glasses of wine per week    Comment: rarely  . Drug use: No  . Sexual activity: Yes    Partners: Male    Birth control/protection: Condom    Comment: pt. declined condoms  Other Topics Concern  . Not on file  Social History Narrative   Caffeine - coca colas occasionally;    Exercise - yes   One level home with partner   Education - 12th grade   Social Determinants of Health   Financial Resource Strain:   . Difficulty of Paying Living Expenses:   Food Insecurity:   . Worried About Programme researcher, broadcasting/film/video in the Last Year:   . Barista in the Last Year:   Transportation Needs:   . Freight forwarder (Medical):   Marland Kitchen Lack of Transportation (Non-Medical):   Physical Activity:   . Days of Exercise per Week:   . Minutes  of Exercise per Session:   Stress:   . Feeling of Stress :   Social Connections:   . Frequency of Communication with Friends and Family:   . Frequency of Social Gatherings with Friends and Family:   . Attends Religious Services:   . Active Member of Clubs or Organizations:   . Attends Banker Meetings:   Marland Kitchen Marital Status:   Intimate Partner Violence:   . Fear of Current or Ex-Partner:   . Emotionally Abused:   Marland Kitchen Physically Abused:   . Sexually Abused:     Observations/Objective:   There were no vitals taken for this visit. No acute distress.  Alert and oriented.  Speech fluent and not dysarthric.  Language intact.  Eyes orthophoric on primary gaze.  Face symmetric.  Assessment and Plan:   Episodic cluster headache, not intractable.  Headache-free since May 2020.  1.  For preventative management, Emgality monthly; Depakote ER  1000mg  daily 2.  For abortive therapy, sumatriptan 5mg  NS 3.  Limit use of pain relievers to no more than 2 days out of week to prevent risk of rebound or medication-overuse headache. 4.  Keep headache diary 5. Follow up one year   Follow Up Instructions:    -I discussed the assessment and treatment plan with the patient. The patient was provided an opportunity to ask questions and all were answered. The patient agreed with the plan and demonstrated an understanding of the instructions.   The patient was advised to call back or seek an in-person evaluation if the symptoms worsen or if the condition fails to improve as anticipated.    , DO

## 2019-08-13 ENCOUNTER — Telehealth (INDEPENDENT_AMBULATORY_CARE_PROVIDER_SITE_OTHER): Payer: Self-pay | Admitting: Neurology

## 2019-08-13 ENCOUNTER — Other Ambulatory Visit: Payer: Self-pay

## 2019-08-13 DIAGNOSIS — G44019 Episodic cluster headache, not intractable: Secondary | ICD-10-CM

## 2019-08-28 ENCOUNTER — Telehealth: Payer: Self-pay | Admitting: Pharmacy Technician

## 2019-08-28 NOTE — Telephone Encounter (Addendum)
[facility] Patient Advocate Encounter  Completed and sent [employer] Advancing Access application for [facility] for this patient who is uninsured.    Patient is approved 08/28/2018 through 09/28/2018.  BIN      [id number] PCN    [id number] [id number]    [id number] ID        [id number]  This will bridge him until ADAP approved.  Spoke with patient and faxed card information to [facility].   Netty Starring. Dimas Aguas CPhT Specialty Pharmacy Patient [facility] Phone: [phone] Fax:  [phone]

## 2019-08-29 ENCOUNTER — Encounter: Payer: Self-pay | Admitting: Infectious Diseases

## 2019-08-29 ENCOUNTER — Telehealth: Payer: Self-pay

## 2019-08-29 ENCOUNTER — Other Ambulatory Visit: Payer: Self-pay

## 2019-08-29 NOTE — Telephone Encounter (Signed)
Telephone call to pt, Advised pt that a Fax was received from [employer]: Pt needs Proof of income and RX sent back to continue the process of his application for assistance.   Per pt he don't have any income. Advised unsure, Pt can call and ask what they need for proof.    Pt called back he needs a letter sent stating that to the best of our knowledge pt has no income. Advised pt we will send the letter he reports he has no income along with the script.    Letter and Script signed sent to [facility]. Pt advised of fax sent.

## 2019-09-03 ENCOUNTER — Other Ambulatory Visit: Payer: Self-pay

## 2019-09-03 MED ORDER — EMGALITY 120 MG/ML ~~LOC~~ SOAJ: 120.0000 mg | SUBCUTANEOUS | 11 refills | Status: DC

## 2019-09-22 ENCOUNTER — Other Ambulatory Visit: Payer: Self-pay | Admitting: Infectious Diseases

## 2019-09-22 DIAGNOSIS — B20 Human immunodeficiency virus [HIV] disease: Secondary | ICD-10-CM

## 2019-09-25 ENCOUNTER — Other Ambulatory Visit: Payer: Self-pay

## 2019-09-25 DIAGNOSIS — B20 Human immunodeficiency virus [HIV] disease: Secondary | ICD-10-CM

## 2019-09-25 MED ORDER — ODEFSEY 200-25-25 MG PO TABS: 1.0000 | ORAL_TABLET | Freq: Every day | ORAL | 0 refills | Status: DC

## 2019-09-25 NOTE — Telephone Encounter (Signed)
You are correct.  It is [place].  Thanks.  Netty Starring. Dimas Aguas CPhT Specialty Pharmacy Patient [facility] Phone: [phone] Fax:  [phone]

## 2019-10-11 ENCOUNTER — Other Ambulatory Visit: Payer: Self-pay

## 2019-10-11 ENCOUNTER — Encounter: Payer: Self-pay | Admitting: Infectious Diseases

## 2019-10-11 ENCOUNTER — Ambulatory Visit (INDEPENDENT_AMBULATORY_CARE_PROVIDER_SITE_OTHER): Payer: Self-pay | Admitting: Infectious Diseases

## 2019-10-11 VITALS — BP 130/84 | HR 60 | Temp 98.2°F | Wt 240.2 lb

## 2019-10-11 DIAGNOSIS — B20 Human immunodeficiency virus [HIV] disease: Secondary | ICD-10-CM

## 2019-10-11 DIAGNOSIS — Z113 Encounter for screening for infections with a predominantly sexual mode of transmission: Secondary | ICD-10-CM

## 2019-10-11 DIAGNOSIS — A630 Anogenital (venereal) warts: Secondary | ICD-10-CM

## 2019-10-11 DIAGNOSIS — Z79899 Other long term (current) drug therapy: Secondary | ICD-10-CM

## 2019-10-11 DIAGNOSIS — F17210 Nicotine dependence, cigarettes, uncomplicated: Secondary | ICD-10-CM

## 2019-10-11 DIAGNOSIS — J301 Allergic rhinitis due to pollen: Secondary | ICD-10-CM

## 2019-10-11 DIAGNOSIS — G43809 Other migraine, not intractable, without status migrainosus: Secondary | ICD-10-CM

## 2019-10-11 MED ORDER — ODEFSEY 200-25-25 MG PO TABS: 1.0000 | ORAL_TABLET | Freq: Every day | ORAL | 8 refills | Status: DC

## 2019-10-11 NOTE — Addendum Note (Signed)
Addended by: Mariea Clonts D on: 10/11/2019 04:48 PM   Modules accepted: Orders

## 2019-10-11 NOTE — Addendum Note (Signed)
Addended by: Mariea Clonts D on: 10/11/2019 05:11 PM   Modules accepted: Orders

## 2019-10-11 NOTE — Progress Notes (Signed)
   Subjective:    Patient ID: Kenneth [name], male    DOB: August 24, 1980, 39 y.o.   MRN: [id number]  HPI 39yo M with hx of HIV+ (Dx 2006?), tension/cluster headaches.Was prev seen by neuro. At last visit he was maintianed on depakote. He is also on emgality and has no further headaches.  He was on atripla until 10-2017 when changed to odefsy, to match his partner. Partner is considering cabaneuva.  No problems with this, takes with food.  Has been doing well, trying to finish school.  Has gotten COVID vax.  Has persistent cough- tried allegra previously. Cough is worse when he doesn't take.   HIV 1 RNA Quant (copies/mL)  Date Value  01/18/2019 <20 NOT DETECTED  03/07/2018 <20 NOT DETECTED  10/24/2017 <20 NOT DETECTED   CD4 T Cell Abs (/uL)  Date Value  01/18/2019 1,822 (H)  03/07/2018 840  10/24/2017 680    Review of Systems  Constitutional: Positive for unexpected weight change. Negative for appetite change.  Respiratory: Positive for cough.   Gastrointestinal: Negative for constipation and diarrhea.  Genitourinary: Negative for difficulty urinating.  back at gym to manage wt gain Please see HPI. All other systems reviewed and negative.      Objective:   Physical Exam Vitals reviewed.  Constitutional:      Appearance: Normal appearance. He is obese.  HENT:     Mouth/Throat:     Mouth: Mucous membranes are moist.     Pharynx: No oropharyngeal exudate.  Eyes:     Extraocular Movements: Extraocular movements intact.     Pupils: Pupils are equal, round, and reactive to light.  Cardiovascular:     Rate and Rhythm: Normal rate and regular rhythm.  Pulmonary:     Effort: Pulmonary effort is normal.     Breath sounds: Normal breath sounds.  Abdominal:     General: Bowel sounds are normal. There is no distension.     Palpations: Abdomen is soft.     Tenderness: There is no abdominal tenderness.  Musculoskeletal:        General: Normal range of motion.     Cervical  back: Normal range of motion and neck supple.     Right lower leg: No edema.     Left lower leg: No edema.  Neurological:     General: No focal deficit present.     Mental Status: He is alert.  Psychiatric:        Mood and Affect: Mood normal.           Assessment & Plan:

## 2019-10-11 NOTE — Assessment & Plan Note (Signed)
Quit > 10 yrs ago.  Mark as resolved.

## 2019-10-11 NOTE — Assessment & Plan Note (Signed)
Will refer him to [facility].

## 2019-10-11 NOTE — Assessment & Plan Note (Signed)
Headaches are much better on his current regiemen.  Feels much better.

## 2019-10-11 NOTE — Assessment & Plan Note (Signed)
He will start taking allegra more regularly.

## 2019-10-11 NOTE — Addendum Note (Signed)
Addended by: Valarie Cones on: 10/11/2019 04:25 PM   Modules accepted: Orders

## 2019-10-11 NOTE — Assessment & Plan Note (Signed)
Doing very well Partner also positive and on meds Will refer to pharm for possible cabaneuva Has gotten covid vax Rtc in 9 months with labs prior.

## 2019-10-12 LAB — T-HELPER CELL (CD4) - (RCID CLINIC ONLY)
CD4 % Helper T Cell: 47 % (ref 33–65)
CD4 T Cell Abs: 1066 /uL (ref 400–1790)

## 2019-10-14 LAB — CBC
HCT: 42.3 % (ref 38.5–50.0)
Hemoglobin: 14.1 g/dL (ref 13.2–17.1)
MCH: 29.6 pg (ref 27.0–33.0)
MCHC: 33.3 g/dL (ref 32.0–36.0)
MCV: 88.7 fL (ref 80.0–100.0)
MPV: 10.9 fL (ref 7.5–12.5)
Platelets: 238 10*3/uL (ref 140–400)
RBC: 4.77 10*6/uL (ref 4.20–5.80)
RDW: 13.5 % (ref 11.0–15.0)
WBC: 8.1 10*3/uL (ref 3.8–10.8)

## 2019-10-14 LAB — COMPREHENSIVE METABOLIC PANEL
AG Ratio: 1.6 (calc) (ref 1.0–2.5)
ALT: 15 U/L (ref 9–46)
AST: 11 U/L (ref 10–40)
Albumin: 4.4 g/dL (ref 3.6–5.1)
Alkaline phosphatase (APISO): 55 U/L (ref 36–130)
BUN: 10 mg/dL (ref 7–25)
CO2: 30 mmol/L (ref 20–32)
Calcium: 9.4 mg/dL (ref 8.6–10.3)
Chloride: 106 mmol/L (ref 98–110)
Creat: 1.06 mg/dL (ref 0.60–1.35)
Globulin: 2.8 g/dL (calc) (ref 1.9–3.7)
Glucose, Bld: 77 mg/dL (ref 65–99)
Potassium: 4.4 mmol/L (ref 3.5–5.3)
Sodium: 141 mmol/L (ref 135–146)
Total Bilirubin: 0.5 mg/dL (ref 0.2–1.2)
Total Protein: 7.2 g/dL (ref 6.1–8.1)

## 2019-10-14 LAB — RPR: RPR Ser Ql: NONREACTIVE

## 2019-10-14 LAB — HIV-1 RNA QUANT-NO REFLEX-BLD
HIV 1 RNA Quant: <20 copies/mL
HIV-1 RNA Quant, Log: <1.3 Log copies/mL

## 2019-11-06 ENCOUNTER — Ambulatory Visit: Payer: Self-pay

## 2019-11-08 ENCOUNTER — Other Ambulatory Visit: Payer: Self-pay

## 2019-11-08 ENCOUNTER — Ambulatory Visit: Payer: Self-pay

## 2019-12-04 ENCOUNTER — Other Ambulatory Visit: Payer: Self-pay | Admitting: Neurology

## 2019-12-04 ENCOUNTER — Other Ambulatory Visit (HOSPITAL_COMMUNITY): Payer: Self-pay | Admitting: Neurology

## 2019-12-27 ENCOUNTER — Encounter: Payer: Self-pay | Admitting: Infectious Diseases

## 2020-04-01 ENCOUNTER — Other Ambulatory Visit: Payer: Self-pay

## 2020-04-01 ENCOUNTER — Other Ambulatory Visit: Payer: Self-pay | Admitting: Neurology

## 2020-04-01 MED ORDER — EMGALITY 120 MG/ML ~~LOC~~ SOAJ: 120.0000 mg | SUBCUTANEOUS | 5 refills | Status: DC

## 2020-04-01 NOTE — Telephone Encounter (Signed)
New script sent

## 2020-04-02 ENCOUNTER — Telehealth: Payer: Self-pay | Admitting: Neurology

## 2020-04-02 ENCOUNTER — Other Ambulatory Visit: Payer: Self-pay

## 2020-04-02 NOTE — Telephone Encounter (Signed)
Left message to call office for update on where to send rx too.

## 2020-04-02 NOTE — Telephone Encounter (Signed)
[facility] called to notify office that the patient's refill for Emgality was sent to them but they were told by the patient that he uses [facility] for the emgality. Please send refill there. Thanks!

## 2020-04-02 NOTE — Telephone Encounter (Signed)
Patient advised, rx will be mailed to patient from [facility].

## 2020-04-10 ENCOUNTER — Other Ambulatory Visit: Payer: Self-pay

## 2020-04-10 MED ORDER — EMGALITY 120 MG/ML ~~LOC~~ SOAJ: 120.0000 mg | SUBCUTANEOUS | 4 refills | Status: DC

## 2020-04-10 NOTE — Progress Notes (Signed)
Pt contacted the office pt states [name] needs a new script with Dr.Jaffe signuture and NPI.  Advised pt that we have sent this script to the pharmacy [facility] twice, There is no way oit's not signed..   We will print script have dr.Jaffe to sign and fax it.

## 2020-06-13 ENCOUNTER — Ambulatory Visit: Payer: Self-pay

## 2020-06-13 ENCOUNTER — Other Ambulatory Visit: Payer: Self-pay

## 2020-06-18 ENCOUNTER — Other Ambulatory Visit: Payer: Self-pay | Admitting: Infectious Diseases

## 2020-06-18 DIAGNOSIS — B20 Human immunodeficiency virus [HIV] disease: Secondary | ICD-10-CM

## 2020-06-19 ENCOUNTER — Ambulatory Visit (HOSPITAL_BASED_OUTPATIENT_CLINIC_OR_DEPARTMENT_OTHER): Payer: Self-pay | Admitting: Pharmacist

## 2020-06-19 ENCOUNTER — Other Ambulatory Visit: Payer: Self-pay

## 2020-06-19 ENCOUNTER — Other Ambulatory Visit (HOSPITAL_COMMUNITY): Payer: Self-pay | Admitting: Internal Medicine

## 2020-06-19 ENCOUNTER — Telehealth: Payer: Self-pay

## 2020-06-19 ENCOUNTER — Encounter: Payer: Self-pay | Admitting: Pharmacist

## 2020-06-19 DIAGNOSIS — B20 Human immunodeficiency virus [HIV] disease: Secondary | ICD-10-CM

## 2020-06-19 DIAGNOSIS — Z79899 Other long term (current) drug therapy: Secondary | ICD-10-CM

## 2020-06-19 MED ORDER — ODEFSEY 200-25-25 MG PO TABS: 1.0000 | ORAL_TABLET | Freq: Every day | ORAL | 1 refills | Status: DC

## 2020-06-19 MED FILL — ODEFSEY 200-25-25 MG TABS: 200-25-25 | 30 days supply | Qty: 30 | Fill #0

## 2020-06-19 MED FILL — DIVALPROEX SOD ER 500 MG TA: 500 | 30 days supply | Qty: 60 | Fill #0

## 2020-06-19 NOTE — Telephone Encounter (Signed)
Ax received from [facility] pt needs to send in documentation of Income.   Pt advised.

## 2020-06-19 NOTE — Progress Notes (Signed)
  S: Patient presents today for review of their specialty medication.   Patient is currently taking Odefsey for HIV. Patient is managed by Dr. Ninetta Lights for this.   Dosing: HIV-1 infection, treatment: Oral: One tablet (emtricitabine 200 mg/rilpivirine 25 mg/tenofovir alafenamide 25 mg) once daily. Note: Do not use in patients with HIV RNA ?100,000 copies/mL and/or CD4 count ?200 cells/mm3 (HHS [adult] 2019).  Adherence: confirms. Maintains strict adherence. Takes with food.   Monitoring:  CD4 count: 1066 (abs) HIV RNA level: not detected Renal function: WNL (09/2019)  Hepatic function: WNL (09/2019) S/sx bone density: none reported  Skin reaction: none reported  HBV +/-: hep B immune per ID notes    Current adverse effects: - None reported; doing well   O:     Lab Results  Component Value Date   WBC 8.1 10/11/2019   HGB 14.1 10/11/2019   HCT 42.3 10/11/2019   MCV 88.7 10/11/2019   PLT 238 10/11/2019      Chemistry      Component Value Date/Time   NA 141 10/11/2019 1622   K 4.4 10/11/2019 1622   CL 106 10/11/2019 1622   CO2 30 10/11/2019 1622   BUN 10 10/11/2019 1622   CREATININE 1.06 10/11/2019 1622      Component Value Date/Time   CALCIUM 9.4 10/11/2019 1622   ALKPHOS 61 09/22/2017 1148   AST 11 10/11/2019 1622   ALT 15 10/11/2019 1622   BILITOT 0.5 10/11/2019 1622       Lab Results  Component Value Date   HIV1RNAQUANT <20 NOT DETECTED 10/11/2019    Lab Results  Component Value Date   CD4TCELL 47 10/11/2019   CD4TABS 1,066 10/11/2019    Lab Results  Component Value Date   HEPBSAB POS (A) 10/29/2013     A/P: 1. Medication review: patient currently prescribed Odefsey for HIV and is tolerating it well. Reviewed the medication with the patient, including the following: Charlett Lango is a combination medication used for the treatment of HIV. Possible adverse effects include headache, sleep disorder, abnormal dreams, GI upset, decreased bone mineral density,  depressive disorders, hepatotoxicity, hypersensitivity, immune reconstitution syndrome, lactic acidosis, QT prolongation, and renal toxicity. Administer with a meal. No recommendations for any changes.  Butch Penny, PharmD, Patsy [place], CPP Clinical Pharmacist [facility] [phone]

## 2020-06-26 ENCOUNTER — Telehealth: Payer: Self-pay

## 2020-06-26 NOTE — Telephone Encounter (Signed)
Fax received from [facility], Message: Please verify reapplication.   Telephone call to [facility], Per rep They do not need application they received the one we sent 06/17/20. Please verify pt income information by writting a letter stating pt do not have income per his application states.   Advised rep we can not verify pt income. We do not ask that question of our patients or ask ot to give [place] proof of income. So I will not be able to write a letter attesting to that.   Per rep we do not need to ask pt just write a letter saying he do not have income.  Re advise pt I can not do that.     Telephone call to the pt. No answer, LMOVM for pt to call us back to show proof of income. Per Eber Jones we need something showing that the patient has no income so we can send a letter stating pt has not income.  Other wise we can not sign or send a letter.

## 2020-07-04 ENCOUNTER — Telehealth: Payer: Self-pay | Admitting: Neurology

## 2020-07-04 DIAGNOSIS — G43809 Other migraine, not intractable, without status migrainosus: Secondary | ICD-10-CM

## 2020-07-04 MED ORDER — SUMATRIPTAN 5 MG/ACT NA SOLN: NASAL | 0 refills | Status: AC

## 2020-07-04 NOTE — Telephone Encounter (Signed)
Patient called in stating he needs a prescription sent in to [facility] for the patient's Imitrex.

## 2020-07-04 NOTE — Telephone Encounter (Signed)
Rx printed, signed and faxed.   Patient notified and voiced understanding.

## 2020-07-10 ENCOUNTER — Other Ambulatory Visit: Payer: Self-pay

## 2020-07-10 ENCOUNTER — Ambulatory Visit (INDEPENDENT_AMBULATORY_CARE_PROVIDER_SITE_OTHER): Payer: Self-pay | Admitting: Infectious Diseases

## 2020-07-10 VITALS — BP 126/88 | HR 100 | Temp 98.0°F | Ht 70.0 in | Wt 230.2 lb

## 2020-07-10 DIAGNOSIS — G43809 Other migraine, not intractable, without status migrainosus: Secondary | ICD-10-CM

## 2020-07-10 DIAGNOSIS — Z23 Encounter for immunization: Secondary | ICD-10-CM

## 2020-07-10 DIAGNOSIS — Z79899 Other long term (current) drug therapy: Secondary | ICD-10-CM

## 2020-07-10 DIAGNOSIS — B20 Human immunodeficiency virus [HIV] disease: Secondary | ICD-10-CM

## 2020-07-10 DIAGNOSIS — A630 Anogenital (venereal) warts: Secondary | ICD-10-CM

## 2020-07-10 DIAGNOSIS — J301 Allergic rhinitis due to pollen: Secondary | ICD-10-CM

## 2020-07-10 DIAGNOSIS — F17210 Nicotine dependence, cigarettes, uncomplicated: Secondary | ICD-10-CM

## 2020-07-10 DIAGNOSIS — Z113 Encounter for screening for infections with a predominantly sexual mode of transmission: Secondary | ICD-10-CM

## 2020-07-10 NOTE — Assessment & Plan Note (Addendum)
He is doing well vax are up to date Has questions about HPV vax. He is < 41yo, will start.  U=U Will check labs today Working on wt loss See him back in 9 months.

## 2020-07-10 NOTE — Progress Notes (Signed)
   Subjective:    Patient ID: Kenneth [name], male  DOB: 12-03-1980, 40 y.o.        MRN: [id number]   HPI 40yo M with hx of HIV+ (Dx 2006?), tension/cluster headaches.Was prev seen by neuro. At last visit he was maintianed on depakote. He is also on emgality and has no further headaches. Hewason atripla until 10-2017 when changed to odefsy, to match his partner.  No problems with this, takes with food.  Has been doing well, back to school in fall occas dry cough. Does not keep him up. Has throughout day. No change with seasons.   HIV 1 RNA Quant (copies/mL)  Date Value  10/11/2019 <20 NOT DETECTED  01/18/2019 <20 NOT DETECTED  03/07/2018 <20 NOT DETECTED   CD4 T Cell Abs (/uL)  Date Value  10/11/2019 1,066  01/18/2019 1,822 (H)  03/07/2018 840     Health Maintenance  Topic Date Due  . Janet Berlin  03/11/2015  . COVID-19 Vaccine (3 - Inadvertent risk 4-dose series) 08/29/2019  . INFLUENZA VACCINE  11/25/2019  . Hepatitis C Screening  Completed  . HIV Screening  Completed  . HPV VACCINES  Aged Out      Review of Systems  Constitutional: Negative for chills, fever and weight loss.  Respiratory: Positive for cough. Negative for sputum production and shortness of breath.   Gastrointestinal: Negative for constipation and diarrhea.  Genitourinary: Negative for dysuria.  Please see HPI. All other systems reviewed and negative.     Objective:  Physical Exam Vitals reviewed.  Constitutional:      Appearance: Normal appearance.  HENT:     Mouth/Throat:     Mouth: Mucous membranes are moist.     Pharynx: No oropharyngeal exudate.  Eyes:     Extraocular Movements: Extraocular movements intact.     Pupils: Pupils are equal, round, and reactive to light.  Cardiovascular:     Rate and Rhythm: Normal rate and regular rhythm.  Pulmonary:     Effort: Pulmonary effort is normal.     Breath sounds: Normal breath sounds.  Abdominal:     General: Bowel sounds are normal.  There is no distension.     Palpations: Abdomen is soft.     Tenderness: There is no abdominal tenderness.  Musculoskeletal:     Cervical back: Normal range of motion and neck supple.     Right lower leg: No edema.     Left lower leg: No edema.  Neurological:     Mental Status: He is alert.  Psychiatric:        Mood and Affect: Mood normal.            Assessment & Plan:

## 2020-07-10 NOTE — Assessment & Plan Note (Signed)
Has neuro f/u.  Significantly improved.

## 2020-07-10 NOTE — Addendum Note (Signed)
Addended by: Harley Alto on: 07/10/2020 03:40 PM   Modules accepted: Orders

## 2020-07-10 NOTE — Addendum Note (Signed)
Addended by: HATCHER, JEFFREY C on: 07/10/2020 04:50 PM   Modules accepted: Orders

## 2020-07-10 NOTE — Assessment & Plan Note (Signed)
Will send to allergy Prev flonase did not help.

## 2020-07-10 NOTE — Assessment & Plan Note (Signed)
Has quit, will mark as resolved.  

## 2020-07-10 NOTE — Addendum Note (Signed)
Addended by: Valarie Cones on: 07/10/2020 04:43 PM   Modules accepted: Orders

## 2020-07-10 NOTE — Assessment & Plan Note (Signed)
No new lesions.  Will continue to monitor.

## 2020-07-11 LAB — T-HELPER CELL (CD4) - (RCID CLINIC ONLY)
CD4 % Helper T Cell: 49 % (ref 33–65)
CD4 T Cell Abs: 1156 /uL (ref 400–1790)

## 2020-07-12 LAB — HIV-1 RNA QUANT-NO REFLEX-BLD
HIV 1 RNA Quant: <20 Copies/mL — ABNORMAL HIGH
HIV-1 RNA Quant, Log: <1.3 Log cps/mL — ABNORMAL HIGH

## 2020-07-12 LAB — COMPREHENSIVE METABOLIC PANEL
AG Ratio: 1.7 (calc) (ref 1.0–2.5)
ALT: 23 U/L (ref 9–46)
AST: 20 U/L (ref 10–40)
Albumin: 4.5 g/dL (ref 3.6–5.1)
Alkaline phosphatase (APISO): 47 U/L (ref 36–130)
BUN: 13 mg/dL (ref 7–25)
CO2: 28 mmol/L (ref 20–32)
Calcium: 9.6 mg/dL (ref 8.6–10.3)
Chloride: 106 mmol/L (ref 98–110)
Creat: 1.18 mg/dL (ref 0.60–1.35)
Globulin: 2.7 g/dL (calc) (ref 1.9–3.7)
Glucose, Bld: 91 mg/dL (ref 65–99)
Potassium: 5 mmol/L (ref 3.5–5.3)
Sodium: 141 mmol/L (ref 135–146)
Total Bilirubin: 0.4 mg/dL (ref 0.2–1.2)
Total Protein: 7.2 g/dL (ref 6.1–8.1)

## 2020-07-12 LAB — CBC
HCT: 42.8 % (ref 38.5–50.0)
Hemoglobin: 14.5 g/dL (ref 13.2–17.1)
MCH: 30.3 pg (ref 27.0–33.0)
MCHC: 33.9 g/dL (ref 32.0–36.0)
MCV: 89.5 fL (ref 80.0–100.0)
MPV: 11.1 fL (ref 7.5–12.5)
Platelets: 273 10*3/uL (ref 140–400)
RBC: 4.78 10*6/uL (ref 4.20–5.80)
RDW: 13 % (ref 11.0–15.0)
WBC: 9 10*3/uL (ref 3.8–10.8)

## 2020-07-12 LAB — RPR: RPR Ser Ql: NONREACTIVE

## 2020-07-18 ENCOUNTER — Other Ambulatory Visit (HOSPITAL_COMMUNITY): Payer: Self-pay | Admitting: Neurology

## 2020-07-18 ENCOUNTER — Other Ambulatory Visit: Payer: Self-pay | Admitting: Neurology

## 2020-07-18 MED FILL — DIVALPROEX SOD ER 500 MG TA: 500 | 26 days supply | Qty: 52 | Fill #0

## 2020-07-18 NOTE — Telephone Encounter (Signed)
Pt has a follow up April 19

## 2020-07-21 MED FILL — ODEFSEY 200-25-25 MG TABS: 200-25-25 | 30 days supply | Qty: 30 | Fill #1

## 2020-08-11 NOTE — Progress Notes (Deleted)
NEUROLOGY FOLLOW UP OFFICE NOTE  Kenneth [name] [id number]  Assessment/Plan:   Episodic cluster headache  1.  Headache prevention:  Emgality monthly, Depakote ER 1000mg  daily 2.  Headache rescue:  Sumatriptan 5mg  NS 3.  Limit use of pain relievers to no more than 2 days out of week to prevent risk of rebound or medication-overuse headache. 4.  Keep headache diary 5.  Follow up ***  Subjective:  Kenneth [name] is a 40 year old right-handed man with HIV who follows up for cluster headaches.  ID note reviewed.  UPDATE: Headache-free since last visit.  Last cluster May 2020. Current NSAIDS: no Current analgesics: no Current triptans: Sumatriptan 5mg  NS Current anti-emetic: no Current muscle relaxants: no Current anti-anxiolytic: no Current sleep aide: melatonin 41 Current Antihypertensive medications:no Current Antidepressant medications: no Current Anticonvulsant medications:Depakote ER1000mg  at bedtime Current anti-CGRP: Emgality Other therapy: O2 (concentrator not working but hasn't needed it).   Other medication: Atripla  Depression/anxiety: stable.  07/10/2020 LABS:  CBC with WBC 9, HGB 14.5, HCT 42.8, PLT 273; CMP with Na 141, K 5, Cl 106, CO2 28, Ca 9.6, glucose 91, BUN 13, Cr 1.18, t bili 0.4, ALP 47, AST 20, ALT 23.  HISTORY: Onset: 2004-2005 Location: right frontal/retro-orbital Quality: excruciating Initial Intensity: 10/10 Aura: no Prodrome: no Associated symptoms: Right eye conjunctival injection, lacrimation and droopy eyelid InitialDuration: 30 to 40 minutes InitialFrequency: Occurs in clusters during the warm months from around April-May until September. They occur 2-3 times a day Triggers: No Relieving factors; no Activity: cannot function during attack  Past NSAIDS: multiple Past analgesics: Fioricet, Percocet Past abortive triptans: sumatriptan 5mg  NS Past antihypertensive medications: verapamil  180mg  CR (higher doses ineffective) Past antidepressant medications: no Past anticonvulsant medications: topiramate 100mg  twice daily (higher doses ineffective) Past vitamins/Herbal/Supplements: no Past antihistamines/decongestants: no Other past therapy: Prednisone during cluster  Alcohol: rarely Smoker: no  He had an MRI of the brain with and without contrast on 10/10/06 to evaluate headache, which was personally reviewed and revealed and is unremarkable.  PAST MEDICAL HISTORY: Past Medical History:  Diagnosis Date  . Headaches, cluster   . HIV infection [facility])     MEDICATIONS: Current Outpatient Medications on File Prior to Visit  Medication Sig Dispense Refill  . divalproex (DEPAKOTE ER) 500 MG 24 hr tablet TAKE 2 TABLETS BY MOUTH DAILY 52 tablet 0  . emtricitabine-rilpivir-tenofovir AF (ODEFSEY) 200-25-25 MG TABS tablet Take 1 tablet by mouth daily with breakfast. 60 tablet 1  . Galcanezumab-gnlm (EMGALITY) 120 MG/ML SOAJ Inject 120 mg into the skin every 30 (thirty) days. 1 mL 5  . Galcanezumab-gnlm (EMGALITY) 120 MG/ML SOAJ Inject 120 mg into the skin every 30 (thirty) days. 1 mL 4  . Melatonin 200 MCG TABS Take 1 tablet by mouth at bedtime as needed.    . SUMAtriptan (IMITREX) 5 MG/ACT nasal spray One spray in each nostril once. If no relief may repeat in 2 hours 18 each 0  . vitamin B-12 (CYANOCOBALAMIN) 250 MCG tablet Take 250 mcg by mouth daily.    . vitamin C (ASCORBIC ACID) 500 MG tablet Take 500 mg by mouth daily.    ZYCLARA 3.75 % CREA APPLY TOPICALLY TO THE AFFECTED AREA EVERY OTHER DAY. LEAVE ON FOR 6 HOURS THEN WASH OFF 28 each 0  . ZYCLARA PUMP 2.5 % CREA APPLY TOPICALLY EVERY OTHER DAY AND LEAVE ON FOR 6 HOURS THEN WASH OFF 7.5 g 0   No current facility-administered medications on file prior  to visit.    ALLERGIES: No Known Allergies  FAMILY HISTORY: Family History  Problem Relation Age of Onset  . Stroke Mother   . Heart disease Father        Objective:  *** General: No acute distress.  Patient appears ***-groomed.   Head:  Normocephalic/atraumatic Eyes:  Fundi examined but not visualized Neck: supple, no paraspinal tenderness, full range of motion Heart:  Regular rate and rhythm Lungs:  Clear to auscultation bilaterally Back: No paraspinal tenderness Neurological Exam: alert and oriented to person, place, and time. Attention span and concentration intact, recent and remote memory intact, fund of knowledge intact.  Speech fluent and not dysarthric, language intact.  CN II-XII intact. Bulk and tone normal, muscle strength 5/5 throughout.  Sensation to light touch, temperature and vibration intact.  Deep tendon reflexes 2+ throughout, toes downgoing.  Finger to nose and heel to shin testing intact.  Gait normal, Romberg negative.     Shon Millet, DO  CC: ***

## 2020-08-12 ENCOUNTER — Ambulatory Visit: Payer: Self-pay | Admitting: Neurology

## 2020-08-13 ENCOUNTER — Other Ambulatory Visit (HOSPITAL_COMMUNITY): Payer: Self-pay

## 2020-08-21 ENCOUNTER — Other Ambulatory Visit (HOSPITAL_COMMUNITY): Payer: Self-pay

## 2020-08-21 MED FILL — Emtricitabine-Rilpivirine-Tenofovir AF Tab 200-25-25 MG: ORAL | 60 days supply | Qty: 60 | Fill #0 | Status: CN

## 2020-08-21 MED FILL — Emtricitabine-Rilpivirine-Tenofovir AF Tab 200-25-25 MG: ORAL | 30 days supply | Qty: 30 | Fill #0 | Status: AC

## 2020-09-10 ENCOUNTER — Other Ambulatory Visit (HOSPITAL_COMMUNITY): Payer: Self-pay

## 2020-09-11 ENCOUNTER — Other Ambulatory Visit (HOSPITAL_BASED_OUTPATIENT_CLINIC_OR_DEPARTMENT_OTHER): Payer: Self-pay

## 2020-09-12 ENCOUNTER — Other Ambulatory Visit (HOSPITAL_COMMUNITY): Payer: Self-pay

## 2020-09-15 ENCOUNTER — Other Ambulatory Visit (HOSPITAL_COMMUNITY): Payer: Self-pay

## 2020-09-15 MED FILL — Emtricitabine-Rilpivirine-Tenofovir AF Tab 200-25-25 MG: ORAL | 30 days supply | Qty: 30 | Fill #1 | Status: AC

## 2020-10-03 ENCOUNTER — Encounter: Payer: Self-pay | Admitting: Infectious Diseases

## 2020-10-06 ENCOUNTER — Other Ambulatory Visit: Payer: Self-pay | Admitting: Infectious Diseases

## 2020-10-06 DIAGNOSIS — B20 Human immunodeficiency virus [HIV] disease: Secondary | ICD-10-CM

## 2020-10-08 ENCOUNTER — Other Ambulatory Visit (HOSPITAL_COMMUNITY): Payer: Self-pay

## 2020-10-08 ENCOUNTER — Other Ambulatory Visit: Payer: Self-pay | Admitting: Infectious Diseases

## 2020-10-08 MED ORDER — ODEFSEY 200-25-25 MG PO TABS
1.0000 | ORAL_TABLET | Freq: Every day | ORAL | 1 refills | Status: DC
  Filled 2020-10-08: qty 60, 60d supply, fill #0
  Filled 2020-10-09: qty 30, 30d supply, fill #0
  Filled 2020-11-04: qty 30, 30d supply, fill #1
  Filled 2020-12-03: qty 30, 30d supply, fill #2
  Filled 2020-12-26: qty 30, 30d supply, fill #3

## 2020-10-09 ENCOUNTER — Other Ambulatory Visit (HOSPITAL_COMMUNITY): Payer: Self-pay

## 2020-10-13 ENCOUNTER — Other Ambulatory Visit (HOSPITAL_COMMUNITY): Payer: Self-pay

## 2020-10-16 ENCOUNTER — Other Ambulatory Visit: Payer: Self-pay

## 2020-10-16 ENCOUNTER — Encounter: Payer: Self-pay | Admitting: Family

## 2020-10-16 ENCOUNTER — Ambulatory Visit (INDEPENDENT_AMBULATORY_CARE_PROVIDER_SITE_OTHER): Payer: 59 | Admitting: Family

## 2020-10-16 VITALS — BP 150/84 | HR 75 | Temp 98.5°F | Wt 233.0 lb

## 2020-10-16 DIAGNOSIS — Z0000 Encounter for general adult medical examination without abnormal findings: Secondary | ICD-10-CM

## 2020-10-16 DIAGNOSIS — Z23 Encounter for immunization: Secondary | ICD-10-CM | POA: Diagnosis not present

## 2020-10-16 NOTE — Patient Instructions (Signed)
Nice to see you  Continue to take your medications daily.  Plan for follow up with Dr. Ninetta Lights as planned.   Have a great day and stay safe!

## 2020-10-16 NOTE — Assessment & Plan Note (Signed)
Kenneth [name] is a 40 y/o AA male in decent health and no significant findings as we reviewed his medical, surgical and social histories. Discussed recommendations for improving health to include weight loss of 5-10% of current body weight which will help with his elevated blood pressure. Emphasized importance of proteins and limiting sugary/processed foods. Recommended for vision exam at his convenience. Follow up in 1 year or sooner if needed.

## 2020-10-16 NOTE — Progress Notes (Signed)
Subjective:    Patient ID: Kenneth [name], male    DOB: 06-10-1980, 40 y.o.   MRN: [id number]  Chief Complaint  Patient presents with   Follow-up    Pt is requesting a phyiscal, pt doesn't have a pcp , he see dr hatcher     HPI:  Kenneth [name] is a 40 y.o. male who presents today for an annual wellness visit.   1) Health Maintenance -   Diet - Balanced; fruits/vegetables, limits fried foods.  Exercise - 3-4 days per week consisting of cardio/resistance.   2) Preventative Exams / Immunizations:  Dental -- Up to date with routine cleanings.   Vision -- No recent vision exam.    Health Maintenance  Topic Date Due   Pneumococcal Vaccine 31-33 Years old (1 - PCV) Never done   TETANUS/TDAP  03/11/2015   COVID-19 Vaccine (3 - Mixed Product risk series) 08/29/2019   INFLUENZA VACCINE  11/24/2020   Hepatitis C Screening  Completed   HIV Screening  Completed   HPV VACCINES  Aged Out    Immunization History  Administered Date(s) Administered   H1N1 06/14/2008   HPV 9-valent 07/10/2020, 10/16/2020   Hepatitis A 03/10/2005, 01/10/2008   Hepatitis B 03/10/2005, 04/07/2005, 02/15/2007   IPV 12/20/1979, 02/26/1980, 08/13/1981, 11/28/1984   Influenza Split 04/28/2011, 01/12/2012   Influenza Whole 03/10/2005, 02/15/2007, 02/11/2009, 01/15/2010   Influenza, Seasonal, Injecte, Preservative Fre 03/04/2014, 01/25/2017   Influenza,inj,Quad PF,6+ Mos 02/20/2013, 01/15/2015   Influenza-Unspecified 01/10/2019   MMR 01/29/1981, 08/17/1991   Meningococcal Mcv4o 02/02/2017, 01/11/2019   Moderna Sars-Covid-2 Vaccination 07/11/2019, 08/01/2019   PPD Test 03/12/2005   Pneumococcal Conjugate-13 11/16/2017   Pneumococcal Polysaccharide-23 03/10/2005, 01/15/2010, 01/11/2019   Td 03/10/2005     No Known Allergies   Outpatient Medications Prior to Visit  Medication Sig Dispense Refill   divalproex (DEPAKOTE ER) 500 MG 24 hr tablet TAKE 2 TABLETS BY MOUTH DAILY 52 tablet 0    emtricitabine-rilpivir-tenofovir AF (ODEFSEY) 200-25-25 MG TABS tablet TAKE 1 TABLET BY MOUTH DAILY WITH BREAKFAST. 60 tablet 1   Galcanezumab-gnlm (EMGALITY) 120 MG/ML SOAJ Inject 120 mg into the skin every 30 (thirty) days. 1 mL 4   Melatonin 200 MCG TABS Take 1 tablet by mouth at bedtime as needed.     SUMAtriptan (IMITREX) 5 MG/ACT nasal spray One spray in each nostril once. If no relief may repeat in 2 hours 18 each 0   vitamin B-12 (CYANOCOBALAMIN) 250 MCG tablet Take 250 mcg by mouth daily.     vitamin C (ASCORBIC ACID) 500 MG tablet Take 500 mg by mouth daily.     ZYCLARA PUMP 2.5 % CREA APPLY TOPICALLY EVERY OTHER DAY AND LEAVE ON FOR 6 HOURS THEN WASH OFF 7.5 g 0   divalproex (DEPAKOTE ER) 500 MG 24 hr tablet TAKE 2 TABLETS BY MOUTH DAILY 52 tablet 0   divalproex (DEPAKOTE ER) 500 MG 24 hr tablet TAKE 2 TABLETS BY MOUTH DAILY 60 tablet 0   emtricitabine-rilpivir-tenofovir AF (ODEFSEY) 200-25-25 MG TABS tablet Take 1 tablet by mouth daily with breakfast. 60 tablet 1   Galcanezumab-gnlm (EMGALITY) 120 MG/ML SOAJ Inject 120 mg into the skin every 30 (thirty) days. 1 mL 5   ZYCLARA 3.75 % CREA APPLY TOPICALLY TO THE AFFECTED AREA EVERY OTHER DAY. LEAVE ON FOR 6 HOURS THEN [place] OFF 28 each 0   No facility-administered medications prior to visit.     Past Medical History:  Diagnosis Date   Headaches, cluster  HIV infection ([place])      History reviewed. No pertinent surgical history.   Family History  Problem Relation Age of Onset   Stroke Father    Heart disease Father      Social History   Socioeconomic History   Marital status: Significant Other    Spouse name: Not on file   Number of children: Not on file   Years of education: Not on file   Highest education level: Not on file  Occupational History   Not on file  Tobacco Use   Smoking status: Former    Years: 1.00    Pack years: 0.00    Types: Cigarettes    Start date: 09/25/2011   Smokeless tobacco: Never   Vaping Use   Vaping Use: Never used  Substance and Sexual Activity   Alcohol use: Yes    Alcohol/week: 3.0 standard drinks    Types: 3 Glasses of wine per week    Comment: rarely   Drug use: No   Sexual activity: Yes    Partners: Male    Birth control/protection: Condom    Comment: pt. declined condoms  Other Topics Concern   Not on file  Social History Narrative   Caffeine - coca colas occasionally;    Exercise - yes   One level home with partner   Education - 12th grade   Social Determinants of Health   Financial Resource Strain: Not on file  Food Insecurity: Not on file  Transportation Needs: Not on file  Physical Activity: Not on file  Stress: Not on file  Social Connections: Not on file  Intimate Partner Violence: Not on file      Review of Systems  Constitutional: Denies fever, chills, fatigue, or significant weight gain/loss. HENT: Head: Denies headache or neck pain Ears: Denies changes in hearing, ringing in ears, earache, drainage Nose: Denies discharge, stuffiness, itching, nosebleed, sinus pain Throat: Denies sore throat, hoarseness, dry mouth, sores, thrush Eyes: Denies loss/changes in vision, pain, redness, blurry/double vision, flashing lights Cardiovascular: Denies chest pain/discomfort, tightness, palpitations, shortness of breath with activity, difficulty lying down, swelling, sudden awakening with shortness of breath Respiratory: Denies shortness of breath, cough, sputum production, wheezing Gastrointestinal: Denies dysphasia, heartburn, change in appetite, nausea, change in bowel habits, rectal bleeding, constipation, diarrhea, yellow skin or eyes Genitourinary: Denies frequency, urgency, burning/pain, blood in urine, incontinence, change in urinary strength. Musculoskeletal: Denies muscle/joint pain, stiffness, back pain, redness or swelling of joints, trauma Skin: Denies rashes, lumps, itching, dryness, color changes, or hair/nail  changes Neurological: Denies dizziness, fainting, seizures, weakness, numbness, tingling, tremor Psychiatric - Denies nervousness, stress, depression or memory loss Endocrine: Denies heat or cold intolerance, sweating, frequent urination, excessive thirst, changes in appetite Hematologic: Denies ease of bruising or bleeding     Objective:     BP (!) 150/84   Pulse 75   Temp 98.5 F (36.9 C)   Wt 233 lb (105.7 kg)   SpO2 98%   BMI 33.43 kg/m  Nursing note and vital signs reviewed.  Physical Exam Constitutional:      Appearance: He is well-developed.  HENT:     Head: Normocephalic.     Right Ear: Hearing, tympanic membrane, ear canal and external ear normal.     Left Ear: Hearing, tympanic membrane, ear canal and external ear normal.     Nose: Nose normal.     Mouth/Throat:     Pharynx: Uvula midline.  Eyes:     Conjunctiva/sclera: Conjunctivae normal.  Pupils: Pupils are equal, round, and reactive to light.  Neck:     Thyroid: No thyromegaly.     Vascular: No JVD.     Trachea: No tracheal deviation.  Cardiovascular:     Rate and Rhythm: Normal rate and regular rhythm.     Heart sounds: Normal heart sounds.  Pulmonary:     Effort: Pulmonary effort is normal.     Breath sounds: Normal breath sounds.  Abdominal:     General: Bowel sounds are normal. There is no distension.     Palpations: Abdomen is soft. There is no mass.     Tenderness: There is no abdominal tenderness. There is no guarding or rebound.  Musculoskeletal:        General: No tenderness. Normal range of motion.     Cervical back: Neck supple.  Lymphadenopathy:     Cervical: No cervical adenopathy.  Skin:    General: Skin is warm and dry.  Neurological:     Mental Status: He is alert and oriented to person, place, and time.     Cranial Nerves: No cranial nerve deficit.     Motor: No abnormal muscle tone.     Coordination: Coordination normal.     Deep Tendon Reflexes: Reflexes are normal and  symmetric.  Psychiatric:        Behavior: Behavior normal.        Thought Content: Thought content normal.        Judgment: Judgment normal.       Assessment & Plan:   Problem List Items Addressed This Visit       Other   Routine health maintenance - Primary    Mr. Difatta is a 40 y/o AA male in decent health and no significant findings as we reviewed his medical, surgical and social histories. Discussed recommendations for improving health to include weight loss of 5-10% of current body weight which will help with his elevated blood pressure. Emphasized importance of proteins and limiting sugary/processed foods. Recommended for vision exam at his convenience. Follow up in 1 year or sooner if needed.        Other Visit Diagnoses     Need for HPV vaccination       Relevant Orders   HPV 9-valent vaccine,Recombinat (Completed)        I have discontinued Ramondo L. Prosch's Zyclara. I am also having him maintain his vitamin C, Melatonin, vitamin B-12, Zyclara Pump, Emgality, SUMAtriptan, divalproex, and Odefsey.   Follow-up: Follow up 1 year for routine physical    Terri Piedra, NP 10/16/2020 1:25 PM

## 2020-10-21 NOTE — Progress Notes (Signed)
NEUROLOGY FOLLOW UP OFFICE NOTE  Kenneth [name] [id number]  Assessment/Plan:   Episodic cluster headache, not intractable  Emgality Q4wks, Depakote ER 500mg  daily Sumatriptan 5mg  NS as needed Limit use of pain relievers to no more than 2 days out of week to prevent risk of rebound or medication-overuse headache. Keep headache diary Follow up one year  Subjective:  Kenneth [name] is a 40 year old right-handed man with HIV who follows up for cluster headaches.  ID note reviewed.   UPDATE: Last seen in April 2021.  Had a mild headache in the spring, aborted quickly with sumatriptan.  Otherwise, doing well. Taking 500mg  of Depakote Current NSAIDS:  no Current analgesics:  no Current triptans:  Sumatriptan 5mg  NS Current anti-emetic:  no Current muscle relaxants:  no Current anti-anxiolytic:  no Current sleep aide:  melatonin May 2021 Current Antihypertensive medications:  no Current Antidepressant medications:  no Current Anticonvulsant medications: Depakote ER 500mg  at bedtime Current anti-CGRP:  Emgality Other therapy:  O2 (concentrator not working but hasn't needed it).   Other medication:  Atripla   Depression/anxiety:  stable.   07/10/2020 LABS:  CBC with WBC 9, HGB 14.5, HCT 42.8, PLT 273; CMP with Na 141, K 5, Cl 106, Co2 28, Ca 9.6, glucose 91, BUN 13, Cr 1.18, t bili 0.4, ALP 47, AST 20, ALT 23.   HISTORY: Onset:  2004-2005 Location:  right frontal/retro-orbital Quality:  excruciating Initial Intensity:  10/10 Aura:  no Prodrome:  no Associated symptoms:  Right eye conjunctival injection, lacrimation and droopy eyelid Initial Duration:  30 to 40 minutes  Initial Frequency:  Occurs in clusters during the warm months from around April-May until September.  They occur 2-3 times a day Triggers:  No Relieving factors;  no Activity:  cannot function during attack   Past NSAIDS:  multiple Past analgesics:  Fioricet, Percocet Past abortive triptans:  sumatriptan  5mg  NS Past antihypertensive medications:  verapamil 180mg  CR (higher doses ineffective) Past antidepressant medications:  no Past anticonvulsant medications:   topiramate 100mg  twice daily (higher doses ineffective) Past vitamins/Herbal/Supplements:  no Past antihistamines/decongestants:  no Other past therapy:  Prednisone during cluster   Alcohol:  rarely Smoker:  no   He had an MRI of the brain with and without contrast on 10/10/06 to evaluate headache was unremarkable.  PAST MEDICAL HISTORY: Past Medical History:  Diagnosis Date   Headaches, cluster    HIV infection (H[facility])     MEDICATIONS: Current Outpatient Medications on File Prior to Visit  Medication Sig Dispense Refill   divalproex (DEPAKOTE ER) 500 MG 24 hr tablet TAKE 2 TABLETS BY MOUTH DAILY 52 tablet 0   emtricitabine-rilpivir-tenofovir AF (ODEFSEY) 200-25-25 MG TABS tablet TAKE 1 TABLET BY MOUTH DAILY WITH BREAKFAST. 60 tablet 1   Galcanezumab-gnlm (EMGALITY) 120 MG/ML SOAJ Inject 120 mg into the skin every 30 (thirty) days. 1 mL 4   Melatonin 200 MCG TABS Take 1 tablet by mouth at bedtime as needed.     SUMAtriptan (IMITREX) 5 MG/ACT nasal spray One spray in each nostril once. If no relief may repeat in 2 hours 18 each 0   vitamin B-12 (CYANOCOBALAMIN) 250 MCG tablet Take 250 mcg by mouth daily.     vitamin C (ASCORBIC ACID) 500 MG tablet Take 500 mg by mouth daily.     ZYCLARA PUMP 2.5 % CREA APPLY TOPICALLY EVERY OTHER DAY AND LEAVE ON FOR 6 HOURS THEN WASH OFF 7.5 g 0   No current facility-administered medications  on file prior to visit.    ALLERGIES: No Known Allergies  FAMILY HISTORY: Family History  Problem Relation Age of Onset   Stroke Father    Heart disease Father       Objective:  Blood pressure (!) 146/91, pulse 74, height 6' (1.829 m), weight 235 lb (106.6 kg), SpO2 99 %. General: No acute distress.  Patient appears well-groomed.   Head:  Normocephalic/atraumatic Eyes:  Fundi examined but  not visualized Neck: supple, no paraspinal tenderness, full range of motion Heart:  Regular rate and rhythm Lungs:  Clear to auscultation bilaterally Back: No paraspinal tenderness Neurological Exam: alert and oriented to person, place, and time.  Speech fluent and not dysarthric, language intact.  CN II-XII intact. Bulk and tone normal, muscle strength 5/5 throughout.  Sensation to light touch intact.  Deep tendon reflexes 2+ throughout.  Finger to nose testing intact.  Gait normal, Romberg negative.   Shon Millet, DO  CC: Johny Sax, MD

## 2020-10-23 ENCOUNTER — Ambulatory Visit (INDEPENDENT_AMBULATORY_CARE_PROVIDER_SITE_OTHER): Payer: 59 | Admitting: Neurology

## 2020-10-23 ENCOUNTER — Encounter: Payer: Self-pay | Admitting: Neurology

## 2020-10-23 ENCOUNTER — Other Ambulatory Visit: Payer: Self-pay

## 2020-10-23 ENCOUNTER — Ambulatory Visit: Payer: 59

## 2020-10-23 ENCOUNTER — Ambulatory Visit: Payer: Self-pay | Admitting: Neurology

## 2020-10-23 VITALS — BP 146/91 | HR 74 | Ht 72.0 in | Wt 235.0 lb

## 2020-10-23 DIAGNOSIS — G44019 Episodic cluster headache, not intractable: Secondary | ICD-10-CM | POA: Diagnosis not present

## 2020-10-23 NOTE — Progress Notes (Signed)
Assistance forms filled out and faxed.

## 2020-10-23 NOTE — Patient Instructions (Signed)
Emgality every 28 days Depakote ER 500mg  daily Sumatriptan nasal spray as needed.

## 2020-11-04 ENCOUNTER — Other Ambulatory Visit (HOSPITAL_COMMUNITY): Payer: Self-pay

## 2020-11-06 ENCOUNTER — Other Ambulatory Visit (HOSPITAL_COMMUNITY): Payer: Self-pay

## 2020-12-03 ENCOUNTER — Other Ambulatory Visit (HOSPITAL_COMMUNITY): Payer: Self-pay

## 2020-12-05 ENCOUNTER — Telehealth: Payer: Self-pay | Admitting: Neurology

## 2020-12-05 NOTE — Telephone Encounter (Signed)
He has missed his dose of emgality for the month. The RX is not correct. He said this is happening every month and he needs it to be corrected immediately. Rx is not correct. He wants a call asap

## 2020-12-05 NOTE — Telephone Encounter (Signed)
Patient gets [employer]chant [employer] through [employer]. Having issues getting supply. Per patient he is requesting 4 month supply.  Called patient and left a message for a call back.

## 2020-12-08 MED ORDER — EMGALITY 120 MG/ML ~~LOC~~ SOAJ: 120.0000 mg | SUBCUTANEOUS | 2 refills | Status: DC

## 2020-12-08 NOTE — Telephone Encounter (Signed)
New script sent to [facility] Emgality 4 pens with 2 refills.    Per [facility] request  Pt advised.

## 2020-12-11 ENCOUNTER — Telehealth: Payer: Self-pay | Admitting: Neurology

## 2020-12-11 NOTE — Telephone Encounter (Signed)
Patient called and said the pharmacy needs clarification on the device type for Emgality 120 MG.  Call: [phone], a direct # to the pharmacy

## 2020-12-12 NOTE — Telephone Encounter (Signed)
Telephone call to [facility], Spoke to [employer] Prefilled Pen Qt: 4 refills 2.

## 2020-12-26 ENCOUNTER — Other Ambulatory Visit (HOSPITAL_COMMUNITY): Payer: Self-pay

## 2020-12-30 ENCOUNTER — Other Ambulatory Visit (HOSPITAL_COMMUNITY): Payer: Self-pay

## 2021-01-22 ENCOUNTER — Other Ambulatory Visit (HOSPITAL_COMMUNITY): Payer: Self-pay

## 2021-01-22 ENCOUNTER — Other Ambulatory Visit: Payer: Self-pay | Admitting: Infectious Diseases

## 2021-01-22 DIAGNOSIS — B20 Human immunodeficiency virus [HIV] disease: Secondary | ICD-10-CM

## 2021-01-22 MED ORDER — ODEFSEY 200-25-25 MG PO TABS
1.0000 | ORAL_TABLET | Freq: Every day | ORAL | 2 refills | Status: DC
  Filled 2021-01-22: qty 30, 30d supply, fill #0
  Filled 2021-02-18: qty 30, 30d supply, fill #1
  Filled 2021-03-17: qty 30, 30d supply, fill #2

## 2021-01-27 ENCOUNTER — Other Ambulatory Visit (HOSPITAL_COMMUNITY): Payer: Self-pay

## 2021-02-18 ENCOUNTER — Other Ambulatory Visit (HOSPITAL_COMMUNITY): Payer: Self-pay

## 2021-02-20 ENCOUNTER — Other Ambulatory Visit (HOSPITAL_COMMUNITY): Payer: Self-pay

## 2021-02-23 ENCOUNTER — Other Ambulatory Visit (HOSPITAL_COMMUNITY): Payer: Self-pay

## 2021-03-17 ENCOUNTER — Other Ambulatory Visit (HOSPITAL_COMMUNITY): Payer: Self-pay

## 2021-03-23 ENCOUNTER — Other Ambulatory Visit (HOSPITAL_COMMUNITY): Payer: Self-pay

## 2021-04-13 ENCOUNTER — Other Ambulatory Visit: Payer: Self-pay | Admitting: Infectious Diseases

## 2021-04-13 ENCOUNTER — Other Ambulatory Visit (HOSPITAL_COMMUNITY): Payer: Self-pay

## 2021-04-13 DIAGNOSIS — B20 Human immunodeficiency virus [HIV] disease: Secondary | ICD-10-CM

## 2021-04-13 NOTE — Telephone Encounter (Signed)
Appt 12/22

## 2021-04-14 ENCOUNTER — Other Ambulatory Visit: Payer: Self-pay | Admitting: Infectious Diseases

## 2021-04-14 ENCOUNTER — Other Ambulatory Visit (HOSPITAL_COMMUNITY): Payer: Self-pay

## 2021-04-14 DIAGNOSIS — B20 Human immunodeficiency virus [HIV] disease: Secondary | ICD-10-CM

## 2021-04-15 ENCOUNTER — Other Ambulatory Visit (HOSPITAL_COMMUNITY): Payer: Self-pay

## 2021-04-15 ENCOUNTER — Other Ambulatory Visit: Payer: Self-pay | Admitting: Infectious Diseases

## 2021-04-15 DIAGNOSIS — B20 Human immunodeficiency virus [HIV] disease: Secondary | ICD-10-CM

## 2021-04-16 ENCOUNTER — Ambulatory Visit: Payer: Self-pay | Admitting: Infectious Diseases

## 2021-04-16 ENCOUNTER — Other Ambulatory Visit (HOSPITAL_COMMUNITY): Payer: Self-pay

## 2021-04-16 MED ORDER — ODEFSEY 200-25-25 MG PO TABS
1.0000 | ORAL_TABLET | Freq: Every day | ORAL | 0 refills | Status: DC
  Filled 2021-04-16 (×2): qty 30, 30d supply, fill #0

## 2021-04-21 ENCOUNTER — Other Ambulatory Visit (HOSPITAL_COMMUNITY): Payer: Self-pay

## 2021-05-07 ENCOUNTER — Ambulatory Visit (INDEPENDENT_AMBULATORY_CARE_PROVIDER_SITE_OTHER): Payer: 59 | Admitting: Internal Medicine

## 2021-05-07 ENCOUNTER — Other Ambulatory Visit: Payer: Self-pay

## 2021-05-07 ENCOUNTER — Encounter: Payer: Self-pay | Admitting: Internal Medicine

## 2021-05-07 VITALS — BP 150/106 | HR 83 | Temp 98.3°F | Wt 235.0 lb

## 2021-05-07 DIAGNOSIS — S335XXD Sprain of ligaments of lumbar spine, subsequent encounter: Secondary | ICD-10-CM | POA: Diagnosis not present

## 2021-05-07 DIAGNOSIS — Z23 Encounter for immunization: Secondary | ICD-10-CM | POA: Diagnosis not present

## 2021-05-07 DIAGNOSIS — B20 Human immunodeficiency virus [HIV] disease: Secondary | ICD-10-CM

## 2021-05-07 DIAGNOSIS — R030 Elevated blood-pressure reading, without diagnosis of hypertension: Secondary | ICD-10-CM

## 2021-05-07 DIAGNOSIS — Z113 Encounter for screening for infections with a predominantly sexual mode of transmission: Secondary | ICD-10-CM | POA: Diagnosis not present

## 2021-05-07 NOTE — Patient Instructions (Signed)
Hpv vaccine last shot today Tdap booster as well   Labs today   Continue rest/ice-heat/stretch and core muscle exercise for back sprain   Daily monitor of blood pressure -- focus on when back pain better and less use of nsaids for more accurate reflection   Follow up 6 months with dr Johnnye Sima

## 2021-05-07 NOTE — Progress Notes (Signed)
Subjective:    Patient ID: Kenneth [name], male  DOB: 07/11/1980, 41 y.o.        MRN: [id number]  Cc: routine hiv care f/u  HPI 41 yo M with hx of HIV+ (Dx 2006?), tension/cluster headaches.Was prev seen by neuro. At last visit he was maintianed on depakote. He is also on emgality and has no further headaches.  He was on atripla until 10-2017 when changed to odefsy, to match his partner.  No problems with this, takes with food.  Has been doing well, back to school in fall occas dry cough. Does not keep him up. Has throughout day. No change with seasons.    05/07/21 id clinic f/u Seen by Tammy Sours for a physical 09/2020 At baseline health No complaint  Compliant with his edefsey Takes depakote still for headache prevention. 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  He is due for his last shot of hpv series and also a tdap booster  Last week was moving patient, and thinks he might have strained his back. Been using heating pad; taking tylenol/ibuprofen. Easing up. Can't walk/stand a long time. Tried to do some stretching/exercise. Sleeping ok.    He questions an isolated high blood pressure today. Has gained weight during the last few years but loosing weight  HIV 1 RNA Quant  Date Value  07/10/2020 <20 Copies/mL (H)  10/11/2019 <20 NOT DETECTED copies/mL  01/18/2019 <20 NOT DETECTED copies/mL   CD4 T Cell Abs (/uL)  Date Value  07/10/2020 1,156  10/11/2019 1,066  01/18/2019 1,822 (H)     Health Maintenance  Topic Date Due   TETANUS/TDAP  03/11/2015   COVID-19 Vaccine (3 - Mixed Product risk series) 08/29/2019   INFLUENZA VACCINE  11/24/2020   Pneumococcal Vaccine 63-26 Years old (4 - PPSV23 if available, else PCV20) 10/19/2044   Hepatitis C Screening  Completed   HIV Screening  Completed   HPV VACCINES  Aged Out     ROS: All Other Ros  negative    Objective:  Physical Exam; General/constitutional: no distress, pleasant HEENT: Normocephalic, PER, Conj Clear, EOMI, Oropharynx clear Neck supple CV: rrr no mrg Lungs: clear to auscultation, normal respiratory effort Abd: Soft, Nontender Ext: no edema Skin: No Rash Neuro: nonfocal MSK: no peripheral joint swelling/tenderness/warmth; back spines nontender   Labs:  Lab Results  Component Value Date   WBC 9.0 07/10/2020   HGB 14.5 07/10/2020   HCT 42.8 07/10/2020   MCV 89.5 07/10/2020   PLT 273 07/10/2020    Last metabolic panel Lab Results  Component Value Date   GLUCOSE 91 07/10/2020   NA 141 07/10/2020   K 5.0 07/10/2020   CL 106 07/10/2020   CO2 28 07/10/2020   BUN 13 07/10/2020   CREATININE 1.18 07/10/2020   GFRNONAA 79 03/07/2018   CALCIUM 9.6 07/10/2020   PROT 7.2 07/10/2020   ALBUMIN 4.3 09/22/2017   BILITOT 0.4 07/10/2020   ALKPHOS 61 09/22/2017   AST 20 07/10/2020   ALT 23 07/10/2020        Assessment & Plan:   Repeat basic hiv labs Std screening deferred per patient as same steady relationship -- will do rpr however Back pain continue RICE/conservative care Advise to monitor daily bp for 5-10 times but also would be better once starting in a regular routine exercise program and getting off of  NSAIDs  F/u 6 months with Dr Ninetta Lights [facility] on [facility]

## 2021-05-07 NOTE — Addendum Note (Signed)
Addended by: Juanita Laster on: 05/07/2021 09:25 AM   Modules accepted: Orders

## 2021-05-09 LAB — COMPREHENSIVE METABOLIC PANEL
AG Ratio: 1.4 (calc) (ref 1.0–2.5)
ALT: 17 U/L (ref 9–46)
AST: 19 U/L (ref 10–40)
Albumin: 4.3 g/dL (ref 3.6–5.1)
Alkaline phosphatase (APISO): 46 U/L (ref 36–130)
BUN: 9 mg/dL (ref 7–25)
CO2: 31 mmol/L (ref 20–32)
Calcium: 9.5 mg/dL (ref 8.6–10.3)
Chloride: 104 mmol/L (ref 98–110)
Creat: 1.14 mg/dL (ref 0.60–1.29)
Globulin: 3 g/dL (calc) (ref 1.9–3.7)
Glucose, Bld: 83 mg/dL (ref 65–99)
Potassium: 4 mmol/L (ref 3.5–5.3)
Sodium: 140 mmol/L (ref 135–146)
Total Bilirubin: 0.7 mg/dL (ref 0.2–1.2)
Total Protein: 7.3 g/dL (ref 6.1–8.1)

## 2021-05-09 LAB — CBC
HCT: 43.2 % (ref 38.5–50.0)
Hemoglobin: 14.5 g/dL (ref 13.2–17.1)
MCH: 29.5 pg (ref 27.0–33.0)
MCHC: 33.6 g/dL (ref 32.0–36.0)
MCV: 88 fL (ref 80.0–100.0)
MPV: 10.7 fL (ref 7.5–12.5)
Platelets: 263 10*3/uL (ref 140–400)
RBC: 4.91 10*6/uL (ref 4.20–5.80)
RDW: 13.1 % (ref 11.0–15.0)
WBC: 6.9 10*3/uL (ref 3.8–10.8)

## 2021-05-09 LAB — HIV-1 RNA QUANT-NO REFLEX-BLD
HIV 1 RNA Quant: NOT DETECTED Copies/mL
HIV-1 RNA Quant, Log: NOT DETECTED Log cps/mL

## 2021-05-09 LAB — RPR: RPR Ser Ql: NONREACTIVE

## 2021-05-11 ENCOUNTER — Encounter: Payer: Self-pay | Admitting: Internal Medicine

## 2021-05-13 ENCOUNTER — Other Ambulatory Visit: Payer: Self-pay | Admitting: Infectious Diseases

## 2021-05-13 ENCOUNTER — Other Ambulatory Visit (HOSPITAL_COMMUNITY): Payer: Self-pay

## 2021-05-13 DIAGNOSIS — B20 Human immunodeficiency virus [HIV] disease: Secondary | ICD-10-CM

## 2021-05-13 MED ORDER — ODEFSEY 200-25-25 MG PO TABS
1.0000 | ORAL_TABLET | Freq: Every day | ORAL | 5 refills | Status: DC
  Filled 2021-05-13: qty 30, 30d supply, fill #0
  Filled 2021-06-11: qty 30, 30d supply, fill #1
  Filled 2021-07-06 – 2021-07-14 (×2): qty 30, 30d supply, fill #2
  Filled 2021-08-04: qty 30, 30d supply, fill #3
  Filled 2021-08-31: qty 30, 30d supply, fill #4
  Filled 2021-09-29: qty 30, 30d supply, fill #5

## 2021-05-18 ENCOUNTER — Other Ambulatory Visit (HOSPITAL_COMMUNITY): Payer: Self-pay

## 2021-05-19 ENCOUNTER — Other Ambulatory Visit (HOSPITAL_COMMUNITY): Payer: Self-pay

## 2021-06-09 ENCOUNTER — Other Ambulatory Visit (HOSPITAL_COMMUNITY): Payer: Self-pay

## 2021-06-11 ENCOUNTER — Other Ambulatory Visit (HOSPITAL_COMMUNITY): Payer: Self-pay

## 2021-06-16 ENCOUNTER — Other Ambulatory Visit (HOSPITAL_COMMUNITY): Payer: Self-pay

## 2021-06-17 ENCOUNTER — Other Ambulatory Visit (HOSPITAL_COMMUNITY): Payer: Self-pay

## 2021-07-06 ENCOUNTER — Other Ambulatory Visit (HOSPITAL_COMMUNITY): Payer: Self-pay

## 2021-07-13 ENCOUNTER — Other Ambulatory Visit (HOSPITAL_COMMUNITY): Payer: Self-pay

## 2021-07-14 ENCOUNTER — Other Ambulatory Visit (HOSPITAL_COMMUNITY): Payer: Self-pay

## 2021-07-14 ENCOUNTER — Telehealth: Payer: Self-pay

## 2021-07-14 NOTE — Telephone Encounter (Signed)
[facility] Patient Advocate Encounter ? ?Completed and sent [employer] Advancing Access application for [facility] for this patient who is uninsured.   ? ?Patient is approved 07/14/21 through 07/14/22. ? ?BIN      [id number] ?PCN    [id number] ?GRP    [id number] ?ID        [id number] ? ? ?Clearance Coots, CPhT ?Specialty Pharmacy Patient Advocate ?[facility] ?Phone: [phone] ?Fax:  [phone]  ?

## 2021-08-04 ENCOUNTER — Other Ambulatory Visit (HOSPITAL_COMMUNITY): Payer: Self-pay

## 2021-08-10 ENCOUNTER — Other Ambulatory Visit (HOSPITAL_COMMUNITY): Payer: Self-pay

## 2021-08-11 ENCOUNTER — Telehealth: Payer: Self-pay | Admitting: Neurology

## 2021-08-11 NOTE — Telephone Encounter (Signed)
Advised we have not received paperwork from [facility] for Assistance for [facility] for this patient. ? ?Please fill out patient portion and send [place] the application and we will fill out part and fax it off for the patient. ?

## 2021-08-11 NOTE — Telephone Encounter (Signed)
Patient calling about a refill on his emgality. Needs paper work Warden/ranger over. ?

## 2021-08-14 MED ORDER — EMGALITY 120 MG/ML ~~LOC~~ SOAJ
120.0000 mg | SUBCUTANEOUS | 2 refills | Status: AC
Start: 2021-08-14 — End: ?

## 2021-08-18 ENCOUNTER — Other Ambulatory Visit: Payer: Self-pay

## 2021-08-18 NOTE — Progress Notes (Signed)
[name] forms faxed off and [place] forms as well ?Fax received need script.Emgaltity script sent ?

## 2021-08-18 NOTE — Telephone Encounter (Signed)
[name] fax need script, Script faxed to [phone] ?

## 2021-08-31 ENCOUNTER — Other Ambulatory Visit (HOSPITAL_COMMUNITY): Payer: Self-pay

## 2021-09-01 ENCOUNTER — Telehealth: Payer: Self-pay | Admitting: Neurology

## 2021-09-01 NOTE — Telephone Encounter (Signed)
Prescription was faxed to [facility] by [facility] CMA  ?

## 2021-09-01 NOTE — Telephone Encounter (Signed)
Pt called back in. He called [employer]. They need the type which is the single dose pre filled pen on the prescription. ?

## 2021-09-04 ENCOUNTER — Other Ambulatory Visit (HOSPITAL_COMMUNITY): Payer: Self-pay

## 2021-09-17 ENCOUNTER — Encounter: Payer: Self-pay | Admitting: Infectious Diseases

## 2021-09-29 ENCOUNTER — Other Ambulatory Visit (HOSPITAL_COMMUNITY): Payer: Self-pay

## 2021-10-01 ENCOUNTER — Other Ambulatory Visit (HOSPITAL_COMMUNITY): Payer: Self-pay

## 2021-10-22 ENCOUNTER — Ambulatory Visit: Payer: Self-pay | Admitting: Neurology

## 2021-10-22 ENCOUNTER — Other Ambulatory Visit (HOSPITAL_COMMUNITY): Payer: Self-pay

## 2021-10-22 ENCOUNTER — Other Ambulatory Visit: Payer: Self-pay | Admitting: Internal Medicine

## 2021-10-22 DIAGNOSIS — B20 Human immunodeficiency virus [HIV] disease: Secondary | ICD-10-CM

## 2021-10-22 NOTE — Telephone Encounter (Signed)
Has appt on 7/5. Refills pending on appt

## 2021-10-28 ENCOUNTER — Ambulatory Visit: Payer: Self-pay | Admitting: Internal Medicine

## 2021-10-28 ENCOUNTER — Other Ambulatory Visit (HOSPITAL_COMMUNITY): Payer: Self-pay

## 2021-10-28 MED ORDER — ODEFSEY 200-25-25 MG PO TABS
1.0000 | ORAL_TABLET | Freq: Every day | ORAL | 0 refills | Status: DC
  Filled 2021-10-28: qty 30, 30d supply, fill #0

## 2021-10-28 NOTE — Telephone Encounter (Signed)
Patient canceled 7/5 appt.   Sandie Ano, RN

## 2021-11-18 ENCOUNTER — Telehealth: Payer: Self-pay

## 2021-11-18 ENCOUNTER — Other Ambulatory Visit (HOSPITAL_COMMUNITY): Payer: Self-pay

## 2021-11-18 ENCOUNTER — Other Ambulatory Visit: Payer: Self-pay | Admitting: Internal Medicine

## 2021-11-18 DIAGNOSIS — B20 Human immunodeficiency virus [HIV] disease: Secondary | ICD-10-CM

## 2021-11-18 MED ORDER — ODEFSEY 200-25-25 MG PO TABS
1.0000 | ORAL_TABLET | Freq: Every day | ORAL | 1 refills | Status: AC
  Filled 2021-11-18: qty 30, 30d supply, fill #0
  Filled 2021-12-14: qty 30, 30d supply, fill #1

## 2021-11-18 NOTE — Telephone Encounter (Signed)
Patient will be transferring care to [facility]. Refills sent to bridge patient until he is able to establish care.   Sandie Ano, RN

## 2021-11-23 ENCOUNTER — Other Ambulatory Visit (HOSPITAL_COMMUNITY): Payer: Self-pay

## 2021-11-26 ENCOUNTER — Other Ambulatory Visit (HOSPITAL_COMMUNITY): Payer: Self-pay

## 2021-12-14 ENCOUNTER — Other Ambulatory Visit (HOSPITAL_COMMUNITY): Payer: Self-pay

## 2021-12-18 ENCOUNTER — Other Ambulatory Visit (HOSPITAL_COMMUNITY): Payer: Self-pay

## 2022-01-12 ENCOUNTER — Other Ambulatory Visit (HOSPITAL_COMMUNITY): Payer: Self-pay

## 2022-03-23 ENCOUNTER — Other Ambulatory Visit (HOSPITAL_COMMUNITY): Payer: Self-pay

## 2022-03-23 ENCOUNTER — Telehealth: Payer: Self-pay

## 2022-03-23 NOTE — Telephone Encounter (Signed)
[facility] Patient Advocate Encounter  Received a letter for [employer] of Benefits.  Patient have [employer] his Copay for Charlett Lango will be $500.00.   I was able to get patient a Copay Card from [place] to make the copay $0.00       Clearance Coots, CPhT Specialty Pharmacy Patient [facility] Phone: [phone] Fax:  [phone]

## 2022-08-03 ENCOUNTER — Telehealth: Payer: Self-pay

## 2022-08-03 NOTE — Telephone Encounter (Signed)
[name] forms received for [employer] assistance.  Telephone call to patient, Hasn't seen Dr.Jaffe since 09/2020. Per Patient he has another provider now. Will shred forms.
